# Patient Record
Sex: Male | Born: 1955 | Race: White | Hispanic: No | Marital: Married | Smoking: Never smoker
Health system: Southern US, Community
[De-identification: ages and names within clinical notes are randomized; demographics above are authoritative.]

## PROBLEM LIST (undated history)

## (undated) DIAGNOSIS — I10 Essential (primary) hypertension: Secondary | ICD-10-CM

## (undated) DIAGNOSIS — I499 Cardiac arrhythmia, unspecified: Secondary | ICD-10-CM

## (undated) DIAGNOSIS — E1140 Type 2 diabetes mellitus with diabetic neuropathy, unspecified: Secondary | ICD-10-CM

## (undated) DIAGNOSIS — E782 Mixed hyperlipidemia: Secondary | ICD-10-CM

## (undated) DIAGNOSIS — I480 Paroxysmal atrial fibrillation: Secondary | ICD-10-CM

## (undated) DIAGNOSIS — E119 Type 2 diabetes mellitus without complications: Secondary | ICD-10-CM

## (undated) DIAGNOSIS — G935 Compression of brain: Secondary | ICD-10-CM

## (undated) DIAGNOSIS — E538 Deficiency of other specified B group vitamins: Secondary | ICD-10-CM

## (undated) DIAGNOSIS — K56609 Unspecified intestinal obstruction, unspecified as to partial versus complete obstruction: Secondary | ICD-10-CM

## (undated) DIAGNOSIS — G629 Polyneuropathy, unspecified: Secondary | ICD-10-CM

## (undated) DIAGNOSIS — E1165 Type 2 diabetes mellitus with hyperglycemia: Secondary | ICD-10-CM

## (undated) DIAGNOSIS — K429 Umbilical hernia without obstruction or gangrene: Secondary | ICD-10-CM

## (undated) DIAGNOSIS — N189 Chronic kidney disease, unspecified: Secondary | ICD-10-CM

## (undated) HISTORY — DX: Essential (primary) hypertension: I10

## (undated) HISTORY — DX: Type 2 diabetes mellitus with diabetic neuropathy, unspecified: E11.40

## (undated) HISTORY — DX: Type 2 diabetes mellitus with hyperglycemia: E11.65

## (undated) HISTORY — DX: Umbilical hernia without obstruction or gangrene: K42.9

## (undated) HISTORY — DX: Mixed hyperlipidemia: E78.2

## (undated) HISTORY — DX: Paroxysmal atrial fibrillation: I48.0

## (undated) HISTORY — DX: Deficiency of other specified B group vitamins: E53.8

## (undated) HISTORY — PX: TONSILLECTOMY: SUR1361

## (undated) HISTORY — DX: Unspecified intestinal obstruction, unspecified as to partial versus complete obstruction: K56.609

---

## 2005-08-26 ENCOUNTER — Ambulatory Visit: Payer: Self-pay | Admitting: Gastroenterology

## 2007-05-25 ENCOUNTER — Ambulatory Visit: Payer: Self-pay | Admitting: Internal Medicine

## 2011-05-27 ENCOUNTER — Ambulatory Visit: Payer: Self-pay | Admitting: Gastroenterology

## 2013-10-24 DIAGNOSIS — E782 Mixed hyperlipidemia: Secondary | ICD-10-CM | POA: Insufficient documentation

## 2013-10-24 DIAGNOSIS — I10 Essential (primary) hypertension: Secondary | ICD-10-CM

## 2013-10-24 HISTORY — DX: Mixed hyperlipidemia: E78.2

## 2013-10-24 HISTORY — DX: Essential (primary) hypertension: I10

## 2013-11-12 DIAGNOSIS — E538 Deficiency of other specified B group vitamins: Secondary | ICD-10-CM

## 2013-11-12 HISTORY — DX: Deficiency of other specified B group vitamins: E53.8

## 2014-05-01 DIAGNOSIS — E1140 Type 2 diabetes mellitus with diabetic neuropathy, unspecified: Secondary | ICD-10-CM

## 2014-05-01 DIAGNOSIS — IMO0002 Reserved for concepts with insufficient information to code with codable children: Secondary | ICD-10-CM | POA: Insufficient documentation

## 2014-05-01 DIAGNOSIS — E1165 Type 2 diabetes mellitus with hyperglycemia: Secondary | ICD-10-CM

## 2014-05-01 HISTORY — DX: Type 2 diabetes mellitus with diabetic neuropathy, unspecified: E11.40

## 2014-05-01 HISTORY — DX: Reserved for concepts with insufficient information to code with codable children: IMO0002

## 2014-11-06 DIAGNOSIS — I480 Paroxysmal atrial fibrillation: Secondary | ICD-10-CM

## 2014-11-06 HISTORY — DX: Paroxysmal atrial fibrillation: I48.0

## 2016-09-29 ENCOUNTER — Emergency Department: Payer: BLUE CROSS/BLUE SHIELD

## 2016-09-29 ENCOUNTER — Inpatient Hospital Stay: Payer: BLUE CROSS/BLUE SHIELD

## 2016-09-29 ENCOUNTER — Inpatient Hospital Stay
Admission: EM | Admit: 2016-09-29 | Discharge: 2016-10-02 | DRG: 395 | Disposition: A | Discharge status: Home / Self Care | Payer: BLUE CROSS/BLUE SHIELD | Attending: Surgery | Admitting: Surgery

## 2016-09-29 ENCOUNTER — Encounter: Payer: Self-pay | Admitting: Emergency Medicine

## 2016-09-29 DIAGNOSIS — Z794 Long term (current) use of insulin: Secondary | ICD-10-CM

## 2016-09-29 DIAGNOSIS — E119 Type 2 diabetes mellitus without complications: Secondary | ICD-10-CM | POA: Diagnosis present

## 2016-09-29 DIAGNOSIS — K429 Umbilical hernia without obstruction or gangrene: Secondary | ICD-10-CM | POA: Diagnosis not present

## 2016-09-29 DIAGNOSIS — R1084 Generalized abdominal pain: Secondary | ICD-10-CM

## 2016-09-29 DIAGNOSIS — K56609 Unspecified intestinal obstruction, unspecified as to partial versus complete obstruction: Secondary | ICD-10-CM | POA: Diagnosis not present

## 2016-09-29 DIAGNOSIS — Z6832 Body mass index (BMI) 32.0-32.9, adult: Secondary | ICD-10-CM

## 2016-09-29 DIAGNOSIS — I4891 Unspecified atrial fibrillation: Secondary | ICD-10-CM | POA: Diagnosis not present

## 2016-09-29 DIAGNOSIS — Z79899 Other long term (current) drug therapy: Secondary | ICD-10-CM | POA: Diagnosis not present

## 2016-09-29 DIAGNOSIS — I482 Chronic atrial fibrillation: Secondary | ICD-10-CM | POA: Diagnosis present

## 2016-09-29 DIAGNOSIS — K420 Umbilical hernia with obstruction, without gangrene: Principal | ICD-10-CM | POA: Diagnosis present

## 2016-09-29 DIAGNOSIS — Z7984 Long term (current) use of oral hypoglycemic drugs: Secondary | ICD-10-CM

## 2016-09-29 DIAGNOSIS — I10 Essential (primary) hypertension: Secondary | ICD-10-CM | POA: Diagnosis present

## 2016-09-29 DIAGNOSIS — K460 Unspecified abdominal hernia with obstruction, without gangrene: Secondary | ICD-10-CM | POA: Diagnosis present

## 2016-09-29 DIAGNOSIS — Z7901 Long term (current) use of anticoagulants: Secondary | ICD-10-CM | POA: Diagnosis not present

## 2016-09-29 HISTORY — DX: Type 2 diabetes mellitus without complications: E11.9

## 2016-09-29 HISTORY — DX: Compression of brain: G93.5

## 2016-09-29 HISTORY — DX: Essential (primary) hypertension: I10

## 2016-09-29 LAB — CBC
HEMATOCRIT: 44.4 % (ref 40.0–52.0)
Hemoglobin: 14.9 g/dL (ref 13.0–18.0)
MCH: 27.6 pg (ref 26.0–34.0)
MCHC: 33.6 g/dL (ref 32.0–36.0)
MCV: 82.2 fL (ref 80.0–100.0)
Platelets: 244 10*3/uL (ref 150–440)
RBC: 5.4 MIL/uL (ref 4.40–5.90)
RDW: 14.8 % — ABNORMAL HIGH (ref 11.5–14.5)
WBC: 8.9 10*3/uL (ref 3.8–10.6)

## 2016-09-29 LAB — URINALYSIS, COMPLETE (UACMP) WITH MICROSCOPIC
BACTERIA UA: NONE SEEN
Glucose, UA: NEGATIVE mg/dL
Hgb urine dipstick: NEGATIVE
KETONES UR: 5 mg/dL — AB
Leukocytes, UA: NEGATIVE
Nitrite: NEGATIVE
PROTEIN: 100 mg/dL — AB
Specific Gravity, Urine: 1.036 — ABNORMAL HIGH (ref 1.005–1.030)
pH: 5 (ref 5.0–8.0)

## 2016-09-29 LAB — COMPREHENSIVE METABOLIC PANEL
ALBUMIN: 4.4 g/dL (ref 3.5–5.0)
ALT: 10 U/L — ABNORMAL LOW (ref 17–63)
AST: 24 U/L (ref 15–41)
Alkaline Phosphatase: 48 U/L (ref 38–126)
Anion gap: 10 (ref 5–15)
BUN: 22 mg/dL — AB (ref 6–20)
CHLORIDE: 100 mmol/L — AB (ref 101–111)
CO2: 28 mmol/L (ref 22–32)
Calcium: 10.2 mg/dL (ref 8.9–10.3)
Creatinine, Ser: 1.32 mg/dL — ABNORMAL HIGH (ref 0.61–1.24)
GFR calc Af Amer: >60 mL/min (ref >60)
GFR calc non Af Amer: 56 mL/min — ABNORMAL LOW (ref >60)
Glucose, Bld: 88 mg/dL (ref 65–99)
POTASSIUM: 3.9 mmol/L (ref 3.5–5.1)
Sodium: 138 mmol/L (ref 135–145)
Total Bilirubin: 1 mg/dL (ref 0.3–1.2)
Total Protein: 7.4 g/dL (ref 6.5–8.1)

## 2016-09-29 LAB — GLUCOSE, CAPILLARY: Glucose-Capillary: 87 mg/dL (ref 65–99)

## 2016-09-29 LAB — LIPASE, BLOOD: Lipase: 21 U/L (ref 11–51)

## 2016-09-29 MED ORDER — METOPROLOL TARTRATE 5 MG/5ML IV SOLN
5.0000 mg | INTRAVENOUS | Status: DC | PRN
  Administered 2016-09-29: 5 mg via INTRAVENOUS
  Filled 2016-09-29: qty 5

## 2016-09-29 MED ORDER — HEPARIN (PORCINE) IN NACL 100-0.45 UNIT/ML-% IJ SOLN
1200.0000 [IU]/h | INTRAMUSCULAR | Status: DC
  Administered 2016-09-29: 1200 [IU]/h via INTRAVENOUS
  Filled 2016-09-29: qty 250

## 2016-09-29 MED ORDER — DILTIAZEM HCL 100 MG IV SOLR
5.0000 mg/h | INTRAVENOUS | Status: DC
  Administered 2016-09-30: 5 mg/h via INTRAVENOUS
  Filled 2016-09-29 (×3): qty 100

## 2016-09-29 MED ORDER — INSULIN ASPART 100 UNIT/ML ~~LOC~~ SOLN
0.0000 [IU] | Freq: Every day | SUBCUTANEOUS | Status: DC
  Administered 2016-10-01: 2 [IU] via SUBCUTANEOUS
  Filled 2016-09-29: qty 2

## 2016-09-29 MED ORDER — INSULIN ASPART 100 UNIT/ML ~~LOC~~ SOLN
6.0000 [IU] | Freq: Three times a day (TID) | SUBCUTANEOUS | Status: DC
  Administered 2016-09-30 – 2016-10-02 (×8): 6 [IU] via SUBCUTANEOUS
  Filled 2016-09-29 (×8): qty 6

## 2016-09-29 MED ORDER — LACTATED RINGERS IV SOLN
INTRAVENOUS | Status: DC
  Administered 2016-09-29 – 2016-09-30 (×3): via INTRAVENOUS

## 2016-09-29 MED ORDER — FENTANYL CITRATE (PF) 100 MCG/2ML IJ SOLN
50.0000 ug | INTRAMUSCULAR | Status: DC | PRN
  Administered 2016-09-29: 50 ug via INTRAVENOUS
  Filled 2016-09-29: qty 2

## 2016-09-29 MED ORDER — ONDANSETRON 4 MG PO TBDP
4.0000 mg | ORAL_TABLET | Freq: Four times a day (QID) | ORAL | Status: DC | PRN
  Filled 2016-09-29: qty 1

## 2016-09-29 MED ORDER — HEPARIN SODIUM (PORCINE) 5000 UNIT/ML IJ SOLN: 5000.0000 [IU] | Freq: Three times a day (TID) | INTRAMUSCULAR | Status: DC

## 2016-09-29 MED ORDER — INSULIN ASPART 100 UNIT/ML ~~LOC~~ SOLN
0.0000 [IU] | Freq: Three times a day (TID) | SUBCUTANEOUS | Status: DC
Start: 2016-09-30 — End: 2016-10-02
  Administered 2016-09-30 (×2): 3 [IU] via SUBCUTANEOUS
  Administered 2016-10-01: 7 [IU] via SUBCUTANEOUS
  Administered 2016-10-01: 4 [IU] via SUBCUTANEOUS
  Administered 2016-10-01: 3 [IU] via SUBCUTANEOUS
  Administered 2016-10-02: 4 [IU] via SUBCUTANEOUS
  Administered 2016-10-02: 7 [IU] via SUBCUTANEOUS
  Filled 2016-09-29: qty 7
  Filled 2016-09-29 (×2): qty 3
  Filled 2016-09-29: qty 7
  Filled 2016-09-29: qty 3
  Filled 2016-09-29 (×2): qty 4

## 2016-09-29 MED ORDER — METOPROLOL TARTRATE 5 MG/5ML IV SOLN: 5.0000 mg | Freq: Four times a day (QID) | INTRAVENOUS | Status: DC

## 2016-09-29 MED ORDER — INSULIN ASPART 100 UNIT/ML ~~LOC~~ SOLN: 0.0000 [IU] | Freq: Three times a day (TID) | SUBCUTANEOUS | Status: DC

## 2016-09-29 MED ORDER — ONDANSETRON HCL 4 MG/2ML IJ SOLN: 4.0000 mg | Freq: Four times a day (QID) | INTRAMUSCULAR | Status: DC | PRN

## 2016-09-29 MED ORDER — PIPERACILLIN-TAZOBACTAM 3.375 G IVPB 30 MIN
3.3750 g | Freq: Once | INTRAVENOUS | Status: AC
  Administered 2016-09-29: 3.375 g via INTRAVENOUS
  Filled 2016-09-29: qty 50

## 2016-09-29 MED ORDER — SODIUM CHLORIDE 0.9 % IV BOLUS (SEPSIS)
500.0000 mL | Freq: Once | INTRAVENOUS | Status: AC
  Administered 2016-09-29: 500 mL via INTRAVENOUS

## 2016-09-29 MED ORDER — METOPROLOL TARTRATE 5 MG/5ML IV SOLN: 5.0000 mg | INTRAVENOUS | Status: DC | PRN

## 2016-09-29 MED ORDER — HYDRALAZINE HCL 20 MG/ML IJ SOLN: 10.0000 mg | INTRAMUSCULAR | Status: DC | PRN

## 2016-09-29 MED ORDER — METOPROLOL TARTRATE 5 MG/5ML IV SOLN
5.0000 mg | Freq: Four times a day (QID) | INTRAVENOUS | Status: DC
  Administered 2016-09-29 – 2016-10-01 (×6): 5 mg via INTRAVENOUS
  Filled 2016-09-29 (×6): qty 5

## 2016-09-29 MED ORDER — IOPAMIDOL (ISOVUE-300) INJECTION 61%
100.0000 mL | Freq: Once | INTRAVENOUS | Status: AC | PRN
  Administered 2016-09-29: 100 mL via INTRAVENOUS

## 2016-09-29 MED ORDER — MORPHINE SULFATE (PF) 2 MG/ML IV SOLN
2.0000 mg | INTRAVENOUS | Status: DC | PRN
Start: 2016-09-29 — End: 2016-10-02

## 2016-09-29 MED ORDER — DILTIAZEM HCL 100 MG IV SOLR
5.0000 mg/h | INTRAVENOUS | Status: DC
  Administered 2016-09-29: 5 mg/h via INTRAVENOUS
  Filled 2016-09-29: qty 100

## 2016-09-29 MED ORDER — ONDANSETRON HCL 4 MG/2ML IJ SOLN
4.0000 mg | Freq: Once | INTRAMUSCULAR | Status: AC
  Administered 2016-09-29: 4 mg via INTRAVENOUS
  Filled 2016-09-29: qty 2

## 2016-09-29 MED ORDER — BENZOCAINE 20 % MT SOLN: Freq: Once | OROMUCOSAL | Status: DC

## 2016-09-29 MED ORDER — HEPARIN BOLUS VIA INFUSION
5000.0000 [IU] | Freq: Once | INTRAVENOUS | Status: AC
  Administered 2016-09-29: 5000 [IU] via INTRAVENOUS
  Filled 2016-09-29: qty 5000

## 2016-09-29 MED ORDER — BUTAMBEN-TETRACAINE-BENZOCAINE 2-2-14 % EX AERO
INHALATION_SPRAY | CUTANEOUS | Status: AC
  Administered 2016-09-29: 1
  Filled 2016-09-29: qty 20

## 2016-09-29 NOTE — ED Notes (Signed)
Per xray, will advance NG slightly per Dr Quentin Cornwall

## 2016-09-29 NOTE — H&P (Signed)
Patient ID: DERREL Space, male   DOB: 1956/04/12, 61 y.o.   MRN: [id number]  HPI Reginald Phillips is a 61 y.o. male with multiple morbidities including diabetes, obesity, hypertension, A. fib on anticoagulation. Percents with a two-day history of abdominal pain nausea and vomiting. He reports that the pain is periumbilical and worsening with certain movements. Last bowel movement was yesterday. He states that he has had this umbilical hernia for several years with intermittent symptoms. This time this is the worst episode he ever had. Told me that the last dose of sterile to was early this morning. CT scan performed and I have personally reviewed there is evidence of multiple dilated loops of small bowel and with incarceration and transition area in the umbilical region. There is no free air no pneumatosis. Creatinine is 1.3 and white count is 8.9. Last echo per cards ote was 1 year ago EF 45% w diastolic Dysfx. No prior abdominal operations  HPI  Past Medical History:  Diagnosis Date  . Diabetes mellitus without complication ([place])   . Hernia cerebri ([place])   . Hypertension     Past Surgical History:  Procedure Laterality Date  . TONSILLECTOMY      No family history on file.  Social History Social History  Substance Use Topics  . Smoking status: Never Smoker  . Smokeless tobacco: Never Used  . Alcohol use No    No Known Allergies  Current Facility-Administered Medications  Medication Dose Route Frequency Provider Last Rate Last Dose  . benzocaine (HURRICAINE) 20 % mouth spray   Mouth/Throat Once Merlyn Lot, MD      . diltiazem (CARDIZEM) 100 mg in dextrose 5 % 100 mL (1 mg/mL) infusion  5-15 mg/hr Intravenous Titrated Merlyn Lot, MD 5 mL/hr at 09/29/16 2023 5 mg/hr at 09/29/16 2023  . fentaNYL (SUBLIMAZE) injection 50 mcg  50 mcg Intravenous Q1H PRN Merlyn Lot, MD   50 mcg at 09/29/16 1815  . metoprolol tartrate (LOPRESSOR) injection 5 mg  5 mg Intravenous Q5  min PRN Merlyn Lot, MD       Current Outpatient Prescriptions  Medication Sig Dispense Refill  . co-enzyme Q-10 50 MG capsule Take 50 mg by mouth daily.    . fenofibrate 160 MG tablet Take 160 mg by mouth daily.    . insulin aspart (NOVOLOG) 100 UNIT/ML injection Inject 40 Units into the skin 3 (three) times daily before meals. Via insulin pump    . liraglutide 18 MG/3ML SOPN Inject 1.8 mg into the skin daily.    Marland Kitchen losartan (COZAAR) 50 MG tablet Take 25 mg by mouth daily.    . metFORMIN (GLUCOPHAGE) 1000 MG tablet Take 1,000 mg by mouth 2 (two) times daily with a meal.    . metoprolol tartrate (LOPRESSOR) 25 MG tablet Take 25 mg by mouth 2 (two) times daily.    . multivitamin (ONE-A-DAY MEN'S) TABS tablet Take 1 tablet by mouth daily.    . rivaroxaban (XARELTO) 20 MG TABS tablet Take 20 mg by mouth every morning.    . rosuvastatin (CRESTOR) 5 MG tablet Take 5 mg by mouth at bedtime. On Monday, Wednesday, and Friday       Review of Systems Full ROS  was asked and was negative except for the information on the HPI  Physical Exam Blood pressure 135/87, pulse (!) 126, temperature 97.7 F (36.5 C), temperature source Oral, resp. rate (!) 26, height 5' 10.5" (1.791 m), weight 108 kg (238 lb), SpO2 94 %.  CONSTITUTIONAL: PT is morbidly obese and chronically ill  EYES: Pupils are equal, round, and reactive to light, Sclera are non-icteric. EARS, NOSE, MOUTH AND THROAT: The oropharynx is clear. The oral mucosa is pink and moist. Hearing is intact to voice. LYMPH NODES:  Lymph nodes in the neck are normal. RESPIRATORY:  Lungs are clear. There is normal respiratory effort, with equal breath sounds bilaterally, and without pathologic use of accessory muscles. CARDIOVASCULAR: Heart is iregular without murmurs, gallops, or rubs., when I examined him his HR was 154 GI: The abdomen is  soft, mildy tender, he did have an incarcerated UH that I was able to gently reduce in the Er. He felt much  better after that. No peritonitis and mild distension. GU: Rectal deferred.   MUSCULOSKELETAL: Normal muscle strength and tone. No cyanosis or edema.   SKIN: Turgor is good and there are no pathologic skin lesions or ulcers. NEUROLOGIC: Motor and sensation is grossly normal. Cranial nerves are grossly intact. PSYCH:  Oriented to person, place and time. Affect is normal.  Data Reviewed  I have personally reviewed the patient's imaging, laboratory findings and medical records.    Assessment/Plan CL incarcerated umbilical hernia now reduced no evidence of peritonitis. Patient with multiple comorbidities including A. fib with RVR, obesity, DM as well as anticoagulated on Xarelto. Currently no need for emergent surgical intervention we will admit him to the hospital for serial abdominal exams, weight for her anticoaug[place]lation to wear off and also obtain cardiology consultation for preoperative optimization. Ideally this patient will benefit from an inpt repair  once his medical issues stabilysed. When I saw him earlier he was in  A. fib with RVR but apparently more recently his rate is controlled. Obviously he has Multiple risk factors that increase the risk of potential complications on the other hand he did develop transient incarceration of his hernia and I do think that the safest thing to do is to do a semi-urgent repair once his condition improves. We will hold anticoagulation for now D/W Dr. Quentin Cornwall in detail as well as with hospitalist on call.   Caroleen Hamman, MD FACS General Surgeon 09/29/2016, 8:32 PM

## 2016-09-29 NOTE — ED Notes (Signed)
NG placement correct per Dr Quentin Cornwall via xray results

## 2016-09-29 NOTE — ED Triage Notes (Signed)
Pt reports sent here by [facility] for evaluation of bowel obstruction. Pt reports generalized abdominal pain. Pt reports nausea and vomiting in the past two days. Pt reports last BM this morning but was very small. Pt with noted abdominal distention in triage. No apparent distress noted.

## 2016-09-29 NOTE — ED Provider Notes (Signed)
[facility] Emergency Department Provider Note    None    (approximate)  I have reviewed the triage vital signs and the nursing notes.   HISTORY  Chief Complaint Abdominal Pain and Bowel Obstruction    HPI Reginald Phillips is a 61 y.o. male history of diabetes as well as a periumbilical hernia presents with 2-3 days of worsening abdominal pain associated with distention and decreased bowel movements. States that he is still passing gas. Has had decreased appetite. States that he did have breakfast but didn't want to eat anything. Does not had any vomiting. Denies any chest pain or shortness of breath. Patient was seen by his PCP today and was directed to the ER due to concern for small bowel obstruction.  Patient states the pain does radiate through to his back. It is moderate in severity. States it is constant.  No melena or hematochezia.   Past Medical History:  Diagnosis Date  . Diabetes mellitus without complication ([place])   . Hernia cerebri ([place])   . Hypertension    No family history on file. Past Surgical History:  Procedure Laterality Date  . TONSILLECTOMY     There are no active problems to display for this patient.     Prior to Admission medications   Not on File    Allergies Patient has no known allergies.    Social History Social History  Substance Use Topics  . Smoking status: Never Smoker  . Smokeless tobacco: Never Used  . Alcohol use No    Review of Systems Patient denies headaches, rhinorrhea, blurry vision, numbness, shortness of breath, chest pain, edema, cough, abdominal pain, nausea, vomiting, diarrhea, dysuria, fevers, rashes or hallucinations unless otherwise stated above in HPI. ____________________________________________   PHYSICAL EXAM:  VITAL SIGNS: Vitals:   09/29/16 1629  BP: (!) 141/72  Pulse: 79  Resp: 18  Temp: 97.7 F (36.5 C)    Constitutional: Alert and oriented. Well appearing and in no  acute distress. Eyes: Conjunctivae are normal.  Head: Atraumatic. Nose: No congestion/rhinnorhea. Mouth/Throat: Mucous membranes are moist.   Neck: No stridor. Painless ROM.  Cardiovascular: Normal rate, regular rhythm. Grossly normal heart sounds.  Good peripheral circulation. Respiratory: Normal respiratory effort.  No retractions. Lungs CTAB. Gastrointestinal: distended, diffusely ttp without guarding or rebound,  Partially reducible periumbilcal hernia without overyling cellulitis or warmth,  No tympany, hypoactive bs Musculoskeletal: No lower extremity tenderness nor edema.  No joint effusions. Neurologic:  Normal speech and language. No gross focal neurologic deficits are appreciated. No facial droop Skin:  Skin is warm, dry and intact. No rash noted. Psychiatric: Mood and affect are normal. Speech and behavior are normal.  ____________________________________________   LABS (all labs ordered are listed, but only abnormal results are displayed)  Results for orders placed or performed during the hospital encounter of 09/29/16 (from the past 24 hour(s))  Lipase, blood     Status: None   Collection Time: 09/29/16  4:29 PM  Result Value Ref Range   Lipase 21 11 - 51 U/L  Comprehensive metabolic panel     Status: Abnormal   Collection Time: 09/29/16  4:29 PM  Result Value Ref Range   Sodium 138 135 - 145 mmol/L   Potassium 3.9 3.5 - 5.1 mmol/L   Chloride 100 (L) 101 - 111 mmol/L   CO2 28 22 - 32 mmol/L   Glucose, Bld 88 65 - 99 mg/dL   BUN 22 (H) 6 - 20 mg/dL  Creatinine, Ser 1.32 (H) 0.61 - 1.24 mg/dL   Calcium 10.2 8.9 - 10.3 mg/dL   Total Protein 7.4 6.5 - 8.1 g/dL   Albumin 4.4 3.5 - 5.0 g/dL   AST 24 15 - 41 U/L   ALT 10 (L) 17 - 63 U/L   Alkaline Phosphatase 48 38 - 126 U/L   Total Bilirubin 1.0 0.3 - 1.2 mg/dL   GFR calc non Af Amer 56 (L) >60 mL/min   GFR calc Af Amer >60 >60 mL/min   Anion gap 10 5 - 15  CBC     Status: Abnormal   Collection Time: 09/29/16   4:29 PM  Result Value Ref Range   WBC 8.9 3.8 - 10.6 K/uL   RBC 5.40 4.40 - 5.90 MIL/uL   Hemoglobin 14.9 13.0 - 18.0 g/dL   HCT 44.4 40.0 - 52.0 %   MCV 82.2 80.0 - 100.0 fL   MCH 27.6 26.0 - 34.0 pg   MCHC 33.6 32.0 - 36.0 g/dL   RDW 14.8 (H) 11.5 - 14.5 %   Platelets 244 150 - 440 K/uL  Urinalysis, Complete w Microscopic     Status: Abnormal   Collection Time: 09/29/16  4:32 PM  Result Value Ref Range   Color, Urine AMBER (A) YELLOW   APPearance HAZY (A) CLEAR   Specific Gravity, Urine 1.036 (H) 1.005 - 1.030   pH 5.0 5.0 - 8.0   Glucose, UA NEGATIVE NEGATIVE mg/dL   Hgb urine dipstick NEGATIVE NEGATIVE   Bilirubin Urine SMALL (A) NEGATIVE   Ketones, ur 5 (A) NEGATIVE mg/dL   Protein, ur 100 (A) NEGATIVE mg/dL   Nitrite NEGATIVE NEGATIVE   Leukocytes, UA NEGATIVE NEGATIVE   RBC / HPF 0-5 0 - 5 RBC/hpf   WBC, UA 0-5 0 - 5 WBC/hpf   Bacteria, UA NONE SEEN NONE SEEN   Squamous Epithelial / LPF 0-5 (A) NONE SEEN   Mucous PRESENT    Ca Oxalate Crys, UA PRESENT    ____________________________________________ ____________________________________________  RADIOLOGY  I personally reviewed all radiographic images ordered to evaluate for the above acute complaints and reviewed radiology reports and findings.  These findings were personally discussed with the patient.  Please see medical record for radiology report.  ____________________________________________   PROCEDURES  Procedure(s) performed:  Procedures    Critical Care performed: yes CRITICAL CARE Performed by: Merlyn Lot   Total critical care time: 30 minutes  Critical care time was exclusive of separately billable procedures and treating other patients.  Critical care was necessary to treat or prevent imminent or life-threatening deterioration.  Critical care was time spent personally by me on the following activities: development of treatment plan with patient and/or surrogate as well as nursing,  discussions with consultants, evaluation of patient's response to treatment, examination of patient, obtaining history from patient or surrogate, ordering and performing treatments and interventions, ordering and review of laboratory studies, ordering and review of radiographic studies, pulse oximetry and re-evaluation of patient's condition.  ____________________________________________   INITIAL IMPRESSION / ASSESSMENT AND PLAN / ED COURSE  Pertinent labs & imaging results that were available during my care of the patient were reviewed by me and considered in my medical decision making (see chart for details).  DDX: ileus, enteritis, hernia, sbo, pancreatitis, mass  Reginald Phillips is a 61 y.o. who presents to the ED with abdominal distention of nausea and bloating. Patient with periumbilical hernia. CT imaging ordered due to concern for small bowel obstruction shows  small bowel obstruction likely secondary to large periumbilical hernia. I was unable to reduce its this at bedside. Dr. Dahlia Byes with general surgery Evaluate patient was able to successfully reduce this at bedside. In the interim patient did go into A. fib with RVR requiring IV doses for rate control as the patient is to be kept nothing by mouth as they are planning to perform hernia repair. Hospice also consult to further assist in medical management.  NG tube inserted without complication.  Have discussed with the patient and available family all diagnostics and treatments performed thus far and all questions were answered to the best of my ability. The patient demonstrates understanding and agreement with plan.       ____________________________________________   FINAL CLINICAL IMPRESSION(S) / ED DIAGNOSES  Final diagnoses:  Periumbilical hernia  Generalized abdominal pain  Atrial fibrillation with rapid ventricular response ([facility])  Small bowel obstruction ([place])      NEW MEDICATIONS STARTED DURING THIS VISIT:  New  Prescriptions   No medications on file     Note:  This document was prepared using Dragon voice recognition software and may include unintentional dictation errors.    Merlyn Lot, MD 09/29/16 2052

## 2016-09-29 NOTE — H&P (Signed)
Patient Demographics  Reginald Phillips, is a 61 y.o. male   MRN: [id number]   DOB - 1955/05/07  Admit Date - 09/29/2016    Outpatient Primary MD for the patient is [name], [name], MD  Consult requested in the Hospital by [place], [place], MD, On 09/29/2016    Reason for consult ; atrial fibrillation, diabetes mellitus management. HPI  Reginald Phillips  is a 61 y.o. male, with history of diabetes mellitus type 2, chronic atrial fibrillation, obesity comes in because of abdominal pain, nausea, vomiting since last Saturday, symptoms are not getting better, patient had periumbilical pain worsening with certain movements. Last BM was yesterday. Patient had him medical hernia for many years and today patient had a CAT scan of abdomen the emergency room which showed small bowel obstruction with incarcerated umbilical hernia. Patient is admitted surgical service. I asked to see patient for medical problems including chronic atrial fibrillation, diabetes mellitus type 2. Patient follows up with Dr. Nehemiah Massed for management of A. fib and he is taking the Xarelto, patient dictated Xarelto this morning. Patient received NG tube in the emergency room, right now he denies abdominal pain. Heart rate is ranging between 100-110.    With History of -  Past Medical History:  Diagnosis Date  . Diabetes mellitus without complication ([place])   . Hernia cerebri ([place])   . Hypertension       Past Surgical History:  Procedure Laterality Date  . TONSILLECTOMY      in for   Chief Complaint  Patient presents with  . Abdominal Pain  . Bowel Obstruction        Review of Systems    In addition to the HPI above,  No Fever-chills, No Headache, No changes with Vision or hearing, No problems swallowing food or Liquids, No Chest pain, Cough or Shortness  of Breath, Abdominal pain, nausea, vomiting for 3 days No Blood in stool or Urine, No dysuria, No new skin rashes or bruises, No new joints pains-aches,  No new weakness, tingling, numbness in any extremity, No recent weight gain or loss, No polyuria, polydypsia or polyphagia, No significant Mental Stressors.  A full 10 point Review of Systems was done, except as stated above, all other Review of Systems were negative.   Social History Social History  Substance Use Topics  . Smoking status: Never Smoker  . Smokeless tobacco: Never Used  . Alcohol use No     Family History No family history on file.   Prior to Admission medications   Medication Sig Start Date End Date Taking? Authorizing Provider  co-enzyme Q-10 50 MG capsule Take 50 mg by mouth daily.   Yes [provider]  fenofibrate 160 MG tablet Take 160 mg by mouth daily.   Yes [provider]  insulin aspart (NOVOLOG) 100 UNIT/ML injection Inject 40 Units into the skin 3 (three) times daily before meals. Via insulin pump   Yes [provider]  liraglutide 18 MG/3ML SOPN Inject 1.8 mg  into the skin daily.   Yes [provider]  losartan (COZAAR) 50 MG tablet Take 25 mg by mouth daily.   Yes [provider]  metFORMIN (GLUCOPHAGE) 1000 MG tablet Take 1,000 mg by mouth 2 (two) times daily with a meal.   Yes [provider]  metoprolol tartrate (LOPRESSOR) 25 MG tablet Take 25 mg by mouth 2 (two) times daily.   Yes [provider]  multivitamin (ONE-A-DAY MEN'S) TABS tablet Take 1 tablet by mouth daily.   Yes [provider]  rivaroxaban (XARELTO) 20 MG TABS tablet Take 20 mg by mouth every morning.   Yes [provider]  rosuvastatin (CRESTOR) 5 MG tablet Take 5 mg by mouth at bedtime. On Monday, Wednesday, and Friday   Yes [provider]    Anti-infectives    Start     Dose/Rate Route Frequency Ordered Stop   09/29/16 1915   piperacillin-tazobactam (ZOSYN) IVPB 3.375 g     3.375 g 100 mL/hr over 30 Minutes Intravenous  Once 09/29/16 1906 09/29/16 2022      Scheduled Meds: . benzocaine   Mouth/Throat Once  . heparin  5,000 Units Subcutaneous Q8H  . [START ON 09/30/2016] insulin aspart  0-20 Units Subcutaneous TID WC  . insulin aspart  0-5 Units Subcutaneous QHS  . [START ON 09/30/2016] insulin aspart  0-9 Units Subcutaneous TID WC  . [START ON 09/30/2016] insulin aspart  6 Units Subcutaneous TID WC  . [START ON 09/30/2016] metoprolol tartrate  5 mg Intravenous Q6H  . [START ON 09/30/2016] metoprolol tartrate  5 mg Intravenous Q6H   Continuous Infusions: . diltiazem (CARDIZEM) infusion 5 mg/hr (09/29/16 2023)  . lactated ringers     PRN Meds:.fentaNYL (SUBLIMAZE) injection, hydrALAZINE, metoprolol tartrate, morphine injection, ondansetron **OR** ondansetron (ZOFRAN) IV  No Known Allergies  Physical Exam  Vitals  Blood pressure 135/87, pulse (!) 126, temperature 97.7 F (36.5 C), temperature source Oral, resp. rate (!) 26, height 5' 10.5" (1.791 m), weight 108 kg (238 lb), SpO2 94 %.   1. General alert,lying in bed  With NG tube.  2. Normal affect and insight, Not Suicidal or Homicidal, Awake Alert, Oriented X 3.  3. No F.N deficits, ALL C.Nerves Intact, Strength 5/5 all 4 extremities, Sensation intact all 4 extremities, Plantars down going.  4. Ears and Eyes appear Normal, Conjunctivae clear, PERRLA. Moist Oral Mucosa.  5. Supple Neck, No JVD, No cervical lymphadenopathy appriciated, No Carotid Bruits.  6. Symmetrical Chest wall movement, Good air movement bilaterally, CTAB.  7. RRR, No Gallops, Rubs or Murmurs, No Parasternal Heave.  8. Abdomen  is distended with poor bowel sounds, umbilical hernia is reduced in the emergency room by surgery. 9.  No Cyanosis, Normal Skin Turgor, No Skin Rash or Bruise.  10. Good muscle tone,  joints appear normal , no effusions, Normal ROM.  11. No Palpable Lymph  Nodes in Neck or Axillae    Data Review  CBC  Recent Labs Lab 09/29/16 1629  WBC 8.9  HGB 14.9  HCT 44.4  PLT 244  MCV 82.2  MCH 27.6  MCHC 33.6  RDW 14.8*   ------------------------------------------------------------------------------------------------------------------  Chemistries   Recent Labs Lab 09/29/16 1629  NA 138  K 3.9  CL 100*  CO2 28  GLUCOSE 88  BUN 22*  CREATININE 1.32*  CALCIUM 10.2  AST 24  ALT 10*  ALKPHOS 48  BILITOT 1.0   ------------------------------------------------------------------------------------------------------------------ estimated creatinine clearance is 72 mL/min (A) (by C-G formula  based on SCr of 1.32 mg/dL (H)). ------------------------------------------------------------------------------------------------------------------ No results for input(s): TSH, T4TOTAL, T3FREE, THYROIDAB in the last 72 hours.  Invalid input(s): FREET3   Coagulation profile No results for input(s): INR, PROTIME in the last 168 hours. ------------------------------------------------------------------------------------------------------------------- No results for input(s): DDIMER in the last 72 hours. -------------------------------------------------------------------------------------------------------------------  Cardiac Enzymes No results for input(s): CKMB, TROPONINI, MYOGLOBIN in the last 168 hours.  Invalid input(s): CK ------------------------------------------------------------------------------------------------------------------ Invalid input(s): POCBNP   ---------------------------------------------------------------------------------------------------------------  Urinalysis    Component Value Date/Time   COLORURINE AMBER (A) 09/29/2016 1632   APPEARANCEUR HAZY (A) 09/29/2016 1632   LABSPEC 1.036 (H) 09/29/2016 1632   PHURINE 5.0 09/29/2016 1632   GLUCOSEU NEGATIVE 09/29/2016 1632   HGBUR NEGATIVE 09/29/2016 1632    BILIRUBINUR SMALL (A) 09/29/2016 1632   KETONESUR 5 (A) 09/29/2016 1632   PROTEINUR 100 (A) 09/29/2016 1632   NITRITE NEGATIVE 09/29/2016 1632   LEUKOCYTESUR NEGATIVE 09/29/2016 1632     Imaging results:   Ct Abdomen Pelvis W Contrast  Result Date: 09/29/2016 CLINICAL DATA:  Pt reports sent here by [facility] [place] for evaluation of bowel obstruction. Pt reports generalized abdominal pain, nausea and vomiting in the past two days. Pt reports last BM this morning but was very small. EXAM: CT ABDOMEN AND PELVIS WITH CONTRAST TECHNIQUE: Multidetector CT imaging of the abdomen and pelvis was performed using the standard protocol following bolus administration of intravenous contrast. CONTRAST:  13mL ISOVUE-300 IOPAMIDOL (ISOVUE-300) INJECTION 61% COMPARISON:  None. FINDINGS: Lower chest: No acute abnormality. Hepatobiliary: No focal liver abnormality is seen. No gallstones, gallbladder wall thickening, or biliary dilatation. Pancreas: Unremarkable. No pancreatic ductal dilatation or surrounding inflammatory changes. Spleen: Normal in size without focal abnormality. Adrenals/Urinary Tract: Normal adrenal glands. Small parapelvic cysts left greater than right. No hydronephrosis or focal renal lesion. Urinary bladder incompletely distended. Stomach/Bowel: Small hiatal hernia. The stomach is nondilated. The duodenum is decompressed. However, there are multiple dilated loops of proximal and mid small bowel. There is small bowel involvement into a large paraumbilical hernia, which represents transition point to decompressed distal ileum. No pneumatosis. No portal venous gas. The colon is nondilated, unremarkable. Vascular/Lymphatic: Mild aortoiliac calcified plaque without aneurysm or stenosis. No adenopathy localized. Reproductive: Prostate is unremarkable. Other: No ascites.  No free air. Musculoskeletal: Minimal spurring in the lower thoracic spine. No fracture or worrisome bone lesion. IMPRESSION: 1. Moderately large  paraumbilical hernia with small bowel involvement, causing distal small bowel obstruction. Electronically Signed   By: Lucrezia Europe M.D.   On: 09/29/2016 18:46   Dg Abd Portable 1 View  Result Date: 09/29/2016 CLINICAL DATA:  Gastric tube placement EXAM: PORTABLE ABDOMEN - 1 VIEW COMPARISON:  Same day CT abdomen and pelvis FINDINGS: The end and side port of a gastric tube are seen below the left hemidiaphragm in the expected location of the proximal stomach. Dilated small bowel loops are seen and upper portion of large bowel. There is no pneumoperitoneum. There is cardiomegaly with atelectasis at the lung bases. No acute nor suspicious osseous abnormalities. IMPRESSION: Gastric tube in the expected location of the proximal stomach. Dilated small bowel loops are noted consistent with small bowel obstruction. Electronically Signed   By: Ashley Royalty M.D.   On: 09/29/2016 20:36    afib with HR100 bpm  Assessment & Plan  Active Problems:   Incarcerated hernia  #1 incarcerated umbilical hernia: Admitted to surgery service, management as per surgery, continue nothing by mouth, discuss with Dr.Pabone and he recommended to hold xarelto, new  to hold Xarelto, pharmacy for heparin drip and consult.  2. A. fib with RVR when the patient came heart rate was in 150s but now rate controlled, patient had abdominal pain and he came ,he  received pain medicine and his pain is better, he is comfortable. Obtain cardiology consultation with Dr. Nehemiah Massed, continue IV metoprolol 5 mg every 6 hours for heart rate more than 100. Continue aggressive hydration for SBO> 3. Diabetes mellitus type 2: On metformin, victoza continue sliding scale with coverage, patient is nothing by mouth.   Thank you for the consult, we will follow the patient with you in the Hospital.   [facility] M.D on 09/29/2016 at 8:49 PM

## 2016-09-29 NOTE — ED Notes (Addendum)
Pt lying supine on stretcher in exam room with no distress noted; pt denies any c/o at present and voices good understanding of plan of care; EKG was performed at 1930 and is on paper chart (did not transmit)

## 2016-09-29 NOTE — ED Notes (Signed)
Dr Ara Kussmaul notified pt's HR persistently 110-160; order obtained to continue cardizem drip

## 2016-09-29 NOTE — Progress Notes (Signed)
Patient arrived to 2A Room 238. Patient denies pain and all questions answered. Patient oriented to unit and Fall Safety Plan signed. Skin assessment completed with Yasmin RN and skin intact; NG tube in right nare set to low-intermittent suction. A&Ox4, VSS, and Afib on verified tele-box #[id number]. Nursing staff will continue to monitor for any changes in patient status. Earleen Reaper, RN

## 2016-09-29 NOTE — ED Notes (Signed)
Pt returned from CT, resting in bed, family at bedside 

## 2016-09-29 NOTE — Progress Notes (Signed)
ANTICOAGULATION CONSULT NOTE - Initial Consult  Pharmacy Consult for heparin Indication: atrial fibrillation  No Known Allergies  Patient Measurements: Height: 5' 10.5" (179.1 cm) Weight: 238 lb (108 kg) IBW/kg (Calculated) : 74.15 Heparin Dosing Weight: 97.3 kg  Vital Signs: Temp: 97.7 F (36.5 C) (06/06 1629) Temp Source: Oral (06/06 1629) BP: 135/87 (06/06 1930) Pulse Rate: 126 (06/06 1930)  Labs:  Recent Labs  09/29/16 1629  HGB 14.9  HCT 44.4  PLT 244  CREATININE 1.32*    Estimated Creatinine Clearance: 72 mL/min (A) (by C-G formula based on SCr of 1.32 mg/dL (H)).   Medical History: Past Medical History:  Diagnosis Date  . Diabetes mellitus without complication ([place])   . Hernia cerebri ([place])   . Hypertension     Medications:  Infusions:  . heparin    . lactated ringers      Assessment: 61 yom cc abdominal pain and bowel obstruction. AF noted, pharmacy consulted to dose heparin for AF.   Goal of Therapy:  Heparin level 0.3-0.7 units/ml Monitor platelets by anticoagulation protocol: Yes   Plan:  Give 5000 units bolus x 1 Start heparin infusion at 1200 units/hr Check anti-Xa level in 6 hours and daily while on heparin Continue to monitor H&H and platelets  Laural Benes, Pharm.D., BCPS Clinical Pharmacist 09/29/2016,9:05 PM

## 2016-09-29 NOTE — ED Notes (Signed)
Portable abd xray completed per protocol for NG placement verification

## 2016-09-29 NOTE — ED Notes (Signed)
Patient transported to CT 

## 2016-09-29 NOTE — Plan of Care (Signed)
Problem: Bowel/Gastric: Goal: Will not experience complications related to bowel motility Outcome: Not Progressing Patient is NPO with NG tube set to low-intermittent suction due to small bowel obstruction.

## 2016-09-29 NOTE — ED Notes (Signed)
Pt HR 120s-140s, afib, hx of afib, EKG done, given to EDP, pt states he takes metoprolol BID and has not taken evening dose, no orders received at this time

## 2016-09-30 ENCOUNTER — Inpatient Hospital Stay: Payer: BLUE CROSS/BLUE SHIELD

## 2016-09-30 LAB — COMPREHENSIVE METABOLIC PANEL
ALBUMIN: 3.4 g/dL — AB (ref 3.5–5.0)
ALT: 8 U/L — ABNORMAL LOW (ref 17–63)
AST: 17 U/L (ref 15–41)
Alkaline Phosphatase: 39 U/L (ref 38–126)
Anion gap: 11 (ref 5–15)
BUN: 22 mg/dL — AB (ref 6–20)
CHLORIDE: 102 mmol/L (ref 101–111)
CO2: 24 mmol/L (ref 22–32)
Calcium: 8.5 mg/dL — ABNORMAL LOW (ref 8.9–10.3)
Creatinine, Ser: 1.15 mg/dL (ref 0.61–1.24)
GFR calc Af Amer: >60 mL/min (ref >60)
GFR calc non Af Amer: >60 mL/min (ref >60)
GLUCOSE: 124 mg/dL — AB (ref 65–99)
POTASSIUM: 3.8 mmol/L (ref 3.5–5.1)
Sodium: 137 mmol/L (ref 135–145)
Total Bilirubin: 1.2 mg/dL (ref 0.3–1.2)
Total Protein: 6.2 g/dL — ABNORMAL LOW (ref 6.5–8.1)

## 2016-09-30 LAB — MAGNESIUM: Magnesium: 1.5 mg/dL — ABNORMAL LOW (ref 1.7–2.4)

## 2016-09-30 LAB — CBC
HEMATOCRIT: 38.9 % — AB (ref 40.0–52.0)
HEMOGLOBIN: 12.9 g/dL — AB (ref 13.0–18.0)
MCH: 27.3 pg (ref 26.0–34.0)
MCHC: 33.3 g/dL (ref 32.0–36.0)
MCV: 82.1 fL (ref 80.0–100.0)
Platelets: 176 10*3/uL (ref 150–440)
RBC: 4.73 MIL/uL (ref 4.40–5.90)
RDW: 14.8 % — ABNORMAL HIGH (ref 11.5–14.5)
WBC: 5.4 10*3/uL (ref 3.8–10.6)

## 2016-09-30 LAB — HEPARIN LEVEL (UNFRACTIONATED)
Heparin Unfractionated: 1.19 IU/mL — ABNORMAL HIGH (ref 0.30–0.70)
Heparin Unfractionated: 0.42 IU/mL (ref 0.30–0.70)

## 2016-09-30 LAB — APTT
APTT: 64 s — AB (ref 24–36)
aPTT: 75 seconds — ABNORMAL HIGH (ref 24–36)
aPTT: 48 seconds — ABNORMAL HIGH (ref 24–36)

## 2016-09-30 LAB — PROTIME-INR
INR: 1.32
Prothrombin Time: 16.5 seconds — ABNORMAL HIGH (ref 11.4–15.2)

## 2016-09-30 LAB — GLUCOSE, CAPILLARY
GLUCOSE-CAPILLARY: 149 mg/dL — AB (ref 65–99)
GLUCOSE-CAPILLARY: 130 mg/dL — AB (ref 65–99)
Glucose-Capillary: 100 mg/dL — ABNORMAL HIGH (ref 65–99)
Glucose-Capillary: 178 mg/dL — ABNORMAL HIGH (ref 65–99)

## 2016-09-30 MED ORDER — HEPARIN (PORCINE) IN NACL 100-0.45 UNIT/ML-% IJ SOLN
1400.0000 [IU]/h | INTRAMUSCULAR | Status: DC
  Administered 2016-09-30: 1300 [IU]/h via INTRAVENOUS
  Filled 2016-09-30: qty 250

## 2016-09-30 MED ORDER — HEPARIN (PORCINE) IN NACL 100-0.45 UNIT/ML-% IJ SOLN: 950.0000 [IU]/h | INTRAMUSCULAR | Status: DC

## 2016-09-30 MED ORDER — DIGOXIN 0.25 MG/ML IJ SOLN
0.1250 mg | Freq: Every day | INTRAMUSCULAR | Status: DC
  Administered 2016-09-30 – 2016-10-02 (×3): 0.125 mg via INTRAVENOUS
  Filled 2016-09-30 (×3): qty 0.5

## 2016-09-30 MED ORDER — KCL IN DEXTROSE-NACL 20-5-0.45 MEQ/L-%-% IV SOLN
INTRAVENOUS | Status: DC
  Administered 2016-09-30 – 2016-10-02 (×3): via INTRAVENOUS
  Filled 2016-09-30 (×5): qty 1000

## 2016-09-30 MED ORDER — HEPARIN (PORCINE) IN NACL 100-0.45 UNIT/ML-% IJ SOLN: 1200.0000 [IU]/h | INTRAMUSCULAR | Status: DC

## 2016-09-30 MED ORDER — MAGNESIUM SULFATE 2 GM/50ML IV SOLN
2.0000 g | Freq: Once | INTRAVENOUS | Status: AC
  Administered 2016-09-30: 2 g via INTRAVENOUS
  Filled 2016-09-30: qty 50

## 2016-09-30 NOTE — Progress Notes (Signed)
Patient converted back into Afib with HR in the 110s-130s. Notified MD Pyreddy and order to restart cardizem drip given. Nursing staff will continue to monitor for any changes in patient status. Earleen Reaper, RN

## 2016-09-30 NOTE — Consult Note (Signed)
[facility] Cardiology  CARDIOLOGY CONSULT NOTE  Patient ID: Reginald Phillips MRN: [id number] DOB/AGE: [birth year] 62 y.o.  Admit date: 09/29/2016 Referring Physician [place] Primary Physician [facility] Primary Cardiologist Nehemiah Massed Reason for Consultation Atrial fibrillation  HPI: 61 year old gentleman referred for evaluation of atrial fibrillation. The patient has a history of paroxysmal atrial fibrillation, on Xarelto for stroke prevention. He is admitted with a 2 day history of abdominal pain and nausea, found to have incarcerated umbilical hernia which was reduced. Patient reports overall clinical improvement, had bowel movement last evening. Patient is in atrial fibrillation with initial rapid ventricular rate, which appears improved today, with a heart rate of 93 BPM, on intermittent intravenous digoxin and Lopressor. Patient denies chest pain, shortness of breath, palpitations or heart racing. The patient will likely require semiurgent surgical repair during his hospitalization. Xarelto has been held. Patient currently on heparin drip.  Review of systems complete and found to be negative unless listed above     Past Medical History:  Diagnosis Date  . Diabetes mellitus without complication ([place])   . Hernia cerebri ([place])   . Hypertension     Past Surgical History:  Procedure Laterality Date  . TONSILLECTOMY      Prescriptions Prior to Admission  Medication Sig Dispense Refill Last Dose  . co-enzyme Q-10 50 MG capsule Take 50 mg by mouth daily.   09/29/2016 at am  . fenofibrate 160 MG tablet Take 160 mg by mouth daily.   09/29/2016 at am  . insulin aspart (NOVOLOG) 100 UNIT/ML injection Inject 40 Units into the skin 3 (three) times daily before meals. Via insulin pump   09/29/2016 at pm  . liraglutide 18 MG/3ML SOPN Inject 1.8 mg into the skin daily.   09/29/2016 at am  . losartan (COZAAR) 50 MG tablet Take 25 mg by mouth daily.   09/29/2016 at am  . metFORMIN (GLUCOPHAGE) 1000 MG tablet Take 1,000 mg  by mouth 2 (two) times daily with a meal.   09/29/2016 at am  . metoprolol tartrate (LOPRESSOR) 25 MG tablet Take 25 mg by mouth 2 (two) times daily.   09/29/2016 at 0530  . multivitamin (ONE-A-DAY MEN'S) TABS tablet Take 1 tablet by mouth daily.   09/29/2016 at am  . rivaroxaban (XARELTO) 20 MG TABS tablet Take 20 mg by mouth every morning.   09/29/2016 at 0530  . rosuvastatin (CRESTOR) 5 MG tablet Take 5 mg by mouth at bedtime. On Monday, Wednesday, and Friday   09/27/2016 at pm   Social History   Social History  . Marital status: Married    Spouse name: N/A  . Number of children: N/A  . Years of education: N/A   Occupational History  . Not on file.   Social History Main Topics  . Smoking status: Never Smoker  . Smokeless tobacco: Never Used  . Alcohol use No  . Drug use: No  . Sexual activity: Not on file   Other Topics Concern  . Not on file   Social History Narrative  . No narrative on file    No family history on file.    Review of systems complete and found to be negative unless listed above      PHYSICAL EXAM  General: Well developed, well nourished, in no acute distress HEENT:  Normocephalic and atramatic Neck:  No JVD.  Lungs: Clear bilaterally to auscultation and percussion. Heart: HRRR . Normal S1 and S2 without gallops or murmurs.  Abdomen: Bowel sounds are positive, abdomen soft  and non-tender  Msk:  Back normal, normal gait. Normal strength and tone for age. Extremities: No clubbing, cyanosis or edema.   Neuro: Alert and oriented X 3. Psych:  Good affect, responds appropriately  Labs:   Lab Results  Component Value Date   WBC 5.4 09/30/2016   HGB 12.9 (L) 09/30/2016   HCT 38.9 (L) 09/30/2016   MCV 82.1 09/30/2016   PLT 176 09/30/2016    Recent Labs Lab 09/30/16 0355  NA 137  K 3.8  CL 102  CO2 24  BUN 22*  CREATININE 1.15  CALCIUM 8.5*  PROT 6.2*  BILITOT 1.2  ALKPHOS 39  ALT 8*  AST 17  GLUCOSE 124*   No results found for: CKTOTAL,  CKMB, CKMBINDEX, TROPONINI No results found for: CHOL No results found for: HDL No results found for: LDLCALC No results found for: TRIG No results found for: CHOLHDL No results found for: LDLDIRECT    Radiology: Dg Abdomen 1 View  Result Date: 09/29/2016 CLINICAL DATA:  NG tube placement EXAM: ABDOMEN - 1 VIEW COMPARISON:  09/29/2016 FINDINGS: Lateral view of the abdomen demonstrates tip of the esophageal tube to project over proximal stomach. There are dilated loops of small bowel concerning foreign obstruction. There is contrast in the bladder. IMPRESSION: 1. Esophageal tube tip overlies the proximal stomach 2. Dilated loops of small bowel consistent with bowel obstruction Electronically Signed   By: Donavan Foil M.D.   On: 09/29/2016 21:26   Ct Abdomen Pelvis W Contrast  Result Date: 09/29/2016 CLINICAL DATA:  Pt reports sent here by [facility] [place] for evaluation of bowel obstruction. Pt reports generalized abdominal pain, nausea and vomiting in the past two days. Pt reports last BM this morning but was very small. EXAM: CT ABDOMEN AND PELVIS WITH CONTRAST TECHNIQUE: Multidetector CT imaging of the abdomen and pelvis was performed using the standard protocol following bolus administration of intravenous contrast. CONTRAST:  135mL ISOVUE-300 IOPAMIDOL (ISOVUE-300) INJECTION 61% COMPARISON:  None. FINDINGS: Lower chest: No acute abnormality. Hepatobiliary: No focal liver abnormality is seen. No gallstones, gallbladder wall thickening, or biliary dilatation. Pancreas: Unremarkable. No pancreatic ductal dilatation or surrounding inflammatory changes. Spleen: Normal in size without focal abnormality. Adrenals/Urinary Tract: Normal adrenal glands. Small parapelvic cysts left greater than right. No hydronephrosis or focal renal lesion. Urinary bladder incompletely distended. Stomach/Bowel: Small hiatal hernia. The stomach is nondilated. The duodenum is decompressed. However, there are multiple dilated loops of  proximal and mid small bowel. There is small bowel involvement into a large paraumbilical hernia, which represents transition point to decompressed distal ileum. No pneumatosis. No portal venous gas. The colon is nondilated, unremarkable. Vascular/Lymphatic: Mild aortoiliac calcified plaque without aneurysm or stenosis. No adenopathy localized. Reproductive: Prostate is unremarkable. Other: No ascites.  No free air. Musculoskeletal: Minimal spurring in the lower thoracic spine. No fracture or worrisome bone lesion. IMPRESSION: 1. Moderately large paraumbilical hernia with small bowel involvement, causing distal small bowel obstruction. Electronically Signed   By: Lucrezia Europe M.D.   On: 09/29/2016 18:46   Dg Abd 2 Views  Result Date: 09/30/2016 CLINICAL DATA:  Small-bowel obstruction. EXAM: ABDOMEN - 2 VIEW COMPARISON:  09/29/2016.  CT 09/29/2016. FINDINGS: NG tube tip is in the proximal portion stomach. Advancement approximately 8 cm should be considered. Improvement of small bowel distention. Colonic gas pattern is normal. No free air. Contrast is in the bladder from recent CT. No acute bony abnormality identified. IMPRESSION: 1. NG tube tip noted in the proximal stomach. Advancement  of approximately 8 cm should be considered. 2. Interim improvement of small bowel distention. Colonic gas pattern is normal. No free air . Electronically Signed   By: Marcello Moores  Register   On: 09/30/2016 08:13   Dg Abd Portable 1 View  Result Date: 09/29/2016 CLINICAL DATA:  Gastric tube placement EXAM: PORTABLE ABDOMEN - 1 VIEW COMPARISON:  Same day CT abdomen and pelvis FINDINGS: The end and side port of a gastric tube are seen below the left hemidiaphragm in the expected location of the proximal stomach. Dilated small bowel loops are seen and upper portion of large bowel. There is no pneumoperitoneum. There is cardiomegaly with atelectasis at the lung bases. No acute nor suspicious osseous abnormalities. IMPRESSION: Gastric tube  in the expected location of the proximal stomach. Dilated small bowel loops are noted consistent with small bowel obstruction. Electronically Signed   By: Ashley Royalty M.D.   On: 09/29/2016 20:36    EKG: Atrial fibrillation at a rate of 93 BPM  ASSESSMENT AND PLAN:   1. Atrial fibrillation with controlled ventricular rate, on intermittent intravenous digoxin and Lopressor, and heparin drip for stroke prevention 2. Reduced incarcerated umbilical hernia, awaiting semi-urgent surgical repair  Recommendations  1. Continue current medical therapy 2. Continue to hold Xarelto for now 3. Continue heparin drip 4. Proceed with surgical repair of umbilical hernia after withholding Xarelto 2-3 days 5. No further cardiac diagnostics required at this time  Signed: Isaias Cowman MD,PhD, [facility] 09/30/2016, 1:52 PM

## 2016-09-30 NOTE — Progress Notes (Signed)
ANTICOAGULATION CONSULT NOTE - Initial Consult  Pharmacy Consult for heparin Indication: atrial fibrillation  No Known Allergies  Patient Measurements: Height: 5' 10.5" (179.1 cm) Weight: 233 lb (105.7 kg) IBW/kg (Calculated) : 74.15 Heparin Dosing Weight: 97 kg  Vital Signs: Temp: 98.2 F (36.8 C) (06/07 1210) Temp Source: Oral (06/07 1210) BP: 116/73 (06/07 1210) Pulse Rate: 107 (06/07 1210)  Labs:  Recent Labs  09/29/16 1629 09/30/16 0355 09/30/16 0943 09/30/16 1752  HGB 14.9 12.9*  --   --   HCT 44.4 38.9*  --   --   PLT 244 176  --   --   APTT  --  75* 48* 64*  LABPROT  --  16.5*  --   --   INR  --  1.32  --   --   HEPARINUNFRC  --  1.19* 0.42  --   CREATININE 1.32* 1.15  --   --     Estimated Creatinine Clearance: 81.8 mL/min (by C-G formula based on SCr of 1.15 mg/dL).  Medical History: Past Medical History:  Diagnosis Date  . Diabetes mellitus without complication ([place])   . Hernia cerebri ([place])   . Hypertension    Medications:  Infusions:  . dextrose 5 % and 0.45 % NaCl with KCl 20 mEq/L 75 mL/hr at 09/30/16 1811  . diltiazem (CARDIZEM) infusion 5 mg/hr (09/30/16 1548)  . heparin 1,300 Units/hr (09/30/16 1423)    Assessment: Pharmacy consulted to dose and monitor heparin in this 61 year old male admitted with incarcerated umbilical hernia. Patient was taking rivaroxaban PTA for atrial fibrillation and is currently NPO and being anticoagulated with heparin in anticipation of possible surgery.  Last dose of rivaroxaban PTA was 6/6  Goal of Therapy:  Heparin level 0.3-0.7 units/ml  Goal aPTT 66 - 102 sec Monitor platelets by anticoagulation protocol: Yes   Plan:  Will continue to dose off of APTT until APTT and HL correlate within therapeutic range.  APTT = 48 seconds (subtherapeutic) on heparin infusion rate of 1200 units/hr. Does not correlate with HL. Continue to dose off of APTT. Will increase infusion rate to 1300 units/hr and recheck APTT  in 6 hours.   CBC and HL to be checked again tomorrow morning.   6/7 1752 aPTT 64. Increase rate to 1400 units/hr. Will recheck aPTT and HL in 6 hours.  Laural Benes, Pharm.D., BCPS Clinical Pharmacist 09/30/2016,6:55 PM

## 2016-09-30 NOTE — Progress Notes (Signed)
[place] at [facility]                                                                                                                                                                                  Patient Demographics   Reginald Phillips, is a 61 y.o. male, DOB - Jul 13, 1955, RDE:[id number]  Admit date - 09/29/2016   Admitting Physician Jules Husbands, MD  Outpatient Primary MD for the patient is [name], [name], MD   LOS - 1  Subjective:  Patient feeling better has NG tube in place which still drainage   Review of Systems:   CONSTITUTIONAL: No documented fever. No fatigue, weakness. No weight gain, no weight loss.  EYES: No blurry or double vision.  ENT: No tinnitus. No postnasal drip. No redness of the oropharynx.  RESPIRATORY: No cough, no wheeze, no hemoptysis. No dyspnea.  CARDIOVASCULAR: No chest pain. No orthopnea. No palpitations. No syncope.  GASTROINTESTINAL: No nausea, no vomiting or diarrhea. Improved abdominal pain. No melena or hematochezia.  GENITOURINARY: No dysuria or hematuria.  ENDOCRINE: No polyuria or nocturia. No heat or cold intolerance.  HEMATOLOGY: No anemia. No bruising. No bleeding.  INTEGUMENTARY: No rashes. No lesions.  MUSCULOSKELETAL: No arthritis. No swelling. No gout.  NEUROLOGIC: No numbness, tingling, or ataxia. No seizure-type activity.  PSYCHIATRIC: No anxiety. No insomnia. No ADD.    Vitals:   Vitals:   09/29/16 2200 09/29/16 2245 09/30/16 0324 09/30/16 1210  BP: 123/65 125/62 112/66 116/73  Pulse: (!) 125 74 (!) 127 (!) 107  Resp: (!) 22 18 16    Temp:  97.9 F (36.6 C) 97.8 F (36.6 C) 98.2 F (36.8 C)  TempSrc:  Oral Oral Oral  SpO2: 95% 96% 97% 97%  Weight:   233 lb (105.7 kg)   Height:        Wt Readings from Last 3 Encounters:  09/30/16 233 lb (105.7 kg)     Intake/Output Summary (Last 24 hours) at 09/30/16 1342 Last data filed at 09/30/16 0721  Gross per 24 hour  Intake          1622.01 ml   Output              500 ml  Net          1122.01 ml    Physical Exam:   GENERAL: Pleasant-appearing in no apparent distress.  HEAD, EYES, EARS, NOSE AND THROAT: Atraumatic, normocephalic. Extraocular muscles are intact. Pupils equal and reactive to light. Sclerae anicteric. No conjunctival injection. No oro-pharyngeal erythema.  NECK: Supple. There is no jugular venous distention. No bruits, no lymphadenopathy, no thyromegaly.  HEART: Irregularly iregular. No murmurs,  no rubs, no clicks.  LUNGS: Clear to auscultation bilaterally. No rales or rhonchi. No wheezes.  ABDOMEN: Soft, no bowel sounds appreciated EXTREMITIES: No evidence of any cyanosis, clubbing, or peripheral edema.  +2 pedal and radial pulses bilaterally.  NEUROLOGIC: The patient is alert, awake, and oriented x3 with no focal motor or sensory deficits appreciated bilaterally.  SKIN: Moist and warm with no rashes appreciated.  Psych: Not anxious, depressed LN: No inguinal LN enlargement    Antibiotics   Anti-infectives    Start     Dose/Rate Route Frequency Ordered Stop   09/29/16 1915  piperacillin-tazobactam (ZOSYN) IVPB 3.375 g     3.375 g 100 mL/hr over 30 Minutes Intravenous  Once 09/29/16 1906 09/29/16 2022      Medications   Scheduled Meds: . benzocaine   Mouth/Throat Once  . digoxin  0.125 mg Intravenous Daily  . insulin aspart  0-20 Units Subcutaneous TID WC  . insulin aspart  0-5 Units Subcutaneous QHS  . insulin aspart  6 Units Subcutaneous TID WC  . metoprolol tartrate  5 mg Intravenous Q6H   Continuous Infusions: . diltiazem (CARDIZEM) infusion 5 mg/hr (09/30/16 0340)  . heparin 1,300 Units/hr (09/30/16 1230)  . lactated ringers 125 mL/hr at 09/30/16 0615   PRN Meds:.fentaNYL (SUBLIMAZE) injection, hydrALAZINE, morphine injection, ondansetron **OR** ondansetron (ZOFRAN) IV   Data Review:   Micro Results No results found for this or any previous visit (from the past 240 hour(s)).  Radiology  Reports Dg Abdomen 1 View  Result Date: 09/29/2016 CLINICAL DATA:  NG tube placement EXAM: ABDOMEN - 1 VIEW COMPARISON:  09/29/2016 FINDINGS: Lateral view of the abdomen demonstrates tip of the esophageal tube to project over proximal stomach. There are dilated loops of small bowel concerning foreign obstruction. There is contrast in the bladder. IMPRESSION: 1. Esophageal tube tip overlies the proximal stomach 2. Dilated loops of small bowel consistent with bowel obstruction Electronically Signed   By: Donavan Foil M.D.   On: 09/29/2016 21:26   Ct Abdomen Pelvis W Contrast  Result Date: 09/29/2016 CLINICAL DATA:  Pt reports sent here by [facility] for evaluation of bowel obstruction. Pt reports generalized abdominal pain, nausea and vomiting in the past two days. Pt reports last BM this morning but was very small. EXAM: CT ABDOMEN AND PELVIS WITH CONTRAST TECHNIQUE: Multidetector CT imaging of the abdomen and pelvis was performed using the standard protocol following bolus administration of intravenous contrast. CONTRAST:  194mL ISOVUE-300 IOPAMIDOL (ISOVUE-300) INJECTION 61% COMPARISON:  None. FINDINGS: Lower chest: No acute abnormality. Hepatobiliary: No focal liver abnormality is seen. No gallstones, gallbladder wall thickening, or biliary dilatation. Pancreas: Unremarkable. No pancreatic ductal dilatation or surrounding inflammatory changes. Spleen: Normal in size without focal abnormality. Adrenals/Urinary Tract: Normal adrenal glands. Small parapelvic cysts left greater than right. No hydronephrosis or focal renal lesion. Urinary bladder incompletely distended. Stomach/Bowel: Small hiatal hernia. The stomach is nondilated. The duodenum is decompressed. However, there are multiple dilated loops of proximal and mid small bowel. There is small bowel involvement into a large paraumbilical hernia, which represents transition point to decompressed distal ileum. No pneumatosis. No portal venous gas. The colon is  nondilated, unremarkable. Vascular/Lymphatic: Mild aortoiliac calcified plaque without aneurysm or stenosis. No adenopathy localized. Reproductive: Prostate is unremarkable. Other: No ascites.  No free air. Musculoskeletal: Minimal spurring in the lower thoracic spine. No fracture or worrisome bone lesion. IMPRESSION: 1. Moderately large paraumbilical hernia with small bowel involvement, causing distal small bowel obstruction. Electronically Signed  By: Lucrezia Europe M.D.   On: 09/29/2016 18:46   Dg Abd 2 Views  Result Date: 09/30/2016 CLINICAL DATA:  Small-bowel obstruction. EXAM: ABDOMEN - 2 VIEW COMPARISON:  09/29/2016.  CT 09/29/2016. FINDINGS: NG tube tip is in the proximal portion stomach. Advancement approximately 8 cm should be considered. Improvement of small bowel distention. Colonic gas pattern is normal. No free air. Contrast is in the bladder from recent CT. No acute bony abnormality identified. IMPRESSION: 1. NG tube tip noted in the proximal stomach. Advancement of approximately 8 cm should be considered. 2. Interim improvement of small bowel distention. Colonic gas pattern is normal. No free air . Electronically Signed   By: Marcello Moores  Register   On: 09/30/2016 08:13   Dg Abd Portable 1 View  Result Date: 09/29/2016 CLINICAL DATA:  Gastric tube placement EXAM: PORTABLE ABDOMEN - 1 VIEW COMPARISON:  Same day CT abdomen and pelvis FINDINGS: The end and side port of a gastric tube are seen below the left hemidiaphragm in the expected location of the proximal stomach. Dilated small bowel loops are seen and upper portion of large bowel. There is no pneumoperitoneum. There is cardiomegaly with atelectasis at the lung bases. No acute nor suspicious osseous abnormalities. IMPRESSION: Gastric tube in the expected location of the proximal stomach. Dilated small bowel loops are noted consistent with small bowel obstruction. Electronically Signed   By: Ashley Royalty M.D.   On: 09/29/2016 20:36      CBC  Recent Labs Lab 09/29/16 1629 09/30/16 0355  WBC 8.9 5.4  HGB 14.9 12.9*  HCT 44.4 38.9*  PLT 244 176  MCV 82.2 82.1  MCH 27.6 27.3  MCHC 33.6 33.3  RDW 14.8* 14.8*    Chemistries   Recent Labs Lab 09/29/16 1629 09/30/16 0355  NA 138 137  K 3.9 3.8  CL 100* 102  CO2 28 24  GLUCOSE 88 124*  BUN 22* 22*  CREATININE 1.32* 1.15  CALCIUM 10.2 8.5*  MG  --  1.5*  AST 24 17  ALT 10* 8*  ALKPHOS 48 39  BILITOT 1.0 1.2   ------------------------------------------------------------------------------------------------------------------ estimated creatinine clearance is 81.8 mL/min (by C-G formula based on SCr of 1.15 mg/dL). ------------------------------------------------------------------------------------------------------------------ No results for input(s): HGBA1C in the last 72 hours. ------------------------------------------------------------------------------------------------------------------ No results for input(s): CHOL, HDL, LDLCALC, TRIG, CHOLHDL, LDLDIRECT in the last 72 hours. ------------------------------------------------------------------------------------------------------------------ No results for input(s): TSH, T4TOTAL, T3FREE, THYROIDAB in the last 72 hours.  Invalid input(s): FREET3 ------------------------------------------------------------------------------------------------------------------ No results for input(s): VITAMINB12, FOLATE, FERRITIN, TIBC, IRON, RETICCTPCT in the last 72 hours.  Coagulation profile  Recent Labs Lab 09/30/16 0355  INR 1.32    No results for input(s): DDIMER in the last 72 hours.  Cardiac Enzymes No results for input(s): CKMB, TROPONINI, MYOGLOBIN in the last 168 hours.  Invalid input(s): CK ------------------------------------------------------------------------------------------------------------------ Invalid input(s): [place]  Patient is a 61 year old admitted with  incarcerated hernia and small bowel obstruction #1 incarcerated umbilical hernia: Admitted to surgery service,  Xarelto on hold currently on heparin 2. A. fib with RVR currently on IV Cardizem drip. Heart rate still elevated we'll give him a dose of digoxin pending. Continue metoprolol IV   3. Diabetes mellitus type 2:-Continue sliding scale insulin       Code Status Orders        Start     Ordered   09/29/16 2043  Full code  Continuous     09/29/16 2046    Code Status  History    Date Active Date Inactive Code Status Order ID Comments User Context   This patient has a current code status but no historical code status.            DVT Prophylaxis Heparin  Lab Results  Component Value Date   PLT 176 09/30/2016     Time Spent in minutes  68min Greater than 50% of time spent in care coordination and counseling patient regarding the condition and plan of care.   Dustin Flock M.D on 09/30/2016 at 1:42 PM  Between 7am to 6pm - Pager - [phone]  After 6pm go to [email or web address] - password [email or web address] [place] [facility]   Office  [phone]

## 2016-09-30 NOTE — Progress Notes (Signed)
SURGICAL PROGRESS NOTE (cpt [phone])  Hospital Day(s): 1.   Post op day(s):  Marland Kitchen   Interval History: Patient seen and examined, no acute events or new complaints overnight. Patient reports complete resolution of abdominal pain, +flatus, +BM, denies N/V, fever/chills, CP, or SOB. Patient reports lifting 150 lbs chicken feed bags 3 days prior to presenting at [facility] ED. After heavy lifting, hernia did not spontaneously reduce, and patient states he was not aware he could reduce it himself or that it not reducing could cause any problems. Patient expresses willingness to wear abdominal binder, and family states they'd rather him wear the abdominal binder and know to self-reduce his hernia than surgery if not necessary.  Review of Systems:  Constitutional: denies fever, chills  HEENT: denies cough or congestion  Respiratory: denies any shortness of breath  Cardiovascular: denies chest pain or palpitations  Gastrointestinal: abdominal pain, N/V, and bowel function as per interval history Genitourinary: denies burning with urination or urinary frequency Musculoskeletal: denies pain, decreased motor or sensation Integumentary: denies any other rashes or skin discolorations Neurological: denies HA or vision/hearing changes   Vital signs in last 24 hours: [min-max] current  Temp:  [97.7 F (36.5 C)-97.9 F (36.6 C)] 97.8 F (36.6 C) (06/07 0324) Pulse Rate:  [74-127] 127 (06/07 0324) Resp:  [16-26] 16 (06/07 0324) BP: (112-142)/(62-107) 112/66 (06/07 0324) SpO2:  [94 %-97 %] 97 % (06/07 0324) Weight:  [233 lb (105.7 kg)-238 lb (108 kg)] 233 lb (105.7 kg) (06/07 0324)     Height: 5' 10.5" (179.1 cm) Weight: 233 lb (105.7 kg) BMI (Calculated): 33.7   Intake/Output this shift:  Total I/O In: -  Out: 500 [Emesis/NG output:500]   Intake/Output last 2 shifts:  @IOLAST2SHIFTS @   Physical Exam:  Constitutional: alert, cooperative and no distress  HENT: normocephalic without obvious abnormality   Eyes: PERRL, EOM's grossly intact and symmetric  Neuro: CN II - XII grossly intact and symmetric without deficit  Respiratory: breathing non-labored at rest  Cardiovascular: regular rate and sinus rhythm  Gastrointestinal: soft, obese, completely non-tender, non-distended, easily reducible large defect umbilical hernia Musculoskeletal: UE and LE FROM, motor and sensation grossly intact, NT   Labs:  CBC Latest Ref Rng & Units 09/30/2016 09/29/2016  WBC 3.8 - 10.6 K/uL 5.4 8.9  Hemoglobin 13.0 - 18.0 g/dL 12.9(L) 14.9  Hematocrit 40.0 - 52.0 % 38.9(L) 44.4  Platelets 150 - 440 K/uL 176 244   CMP Latest Ref Rng & Units 09/30/2016 09/29/2016  Glucose 65 - 99 mg/dL 124(H) 88  BUN 6 - 20 mg/dL 22(H) 22(H)  Creatinine 0.61 - 1.24 mg/dL 1.15 1.32(H)  Sodium 135 - 145 mmol/L 137 138  Potassium 3.5 - 5.1 mmol/L 3.8 3.9  Chloride 101 - 111 mmol/L 102 100(L)  CO2 22 - 32 mmol/L 24 28  Calcium 8.9 - 10.3 mg/dL 8.5(L) 10.2  Total Protein 6.5 - 8.1 g/dL 6.2(L) 7.4  Total Bilirubin 0.3 - 1.2 mg/dL 1.2 1.0  Alkaline Phos 38 - 126 U/L 39 48  AST 15 - 41 U/L 17 24  ALT 17 - 63 U/L 8(L) 10(L)    Imaging studies:  Abdominal x-ray (09/30/2016) NG tube tip is in the proximal portion stomach. Advancement approximately 8 cm should be considered. Improvement of small bowel distention. Colonic gas pattern is normal. No free air. Contrast is in the bladder from recent CT. No acute bony abnormality identified.  Assessment/Plan: (ICD-10's: K42.0) 61 y.o. male with reduced chronic large-defect umbilical hernia, complicated chronic anticoagulation  for atrial fibrillation with RVR at time of presentation/admission and by pertinent comorbidities including morbid obesity (BMI 44), DM, and HTN.   - reassess NG tube drainage this afternoon   - discontinue NG tube if minimal drainage and ongoing flatus, benign abdominal exam   - medical management of comorbidities and anticoagulation as per medical team   - abdominal  binder advised, may consider outpatient elective surgical repair  All of the above findings and recommendations were discussed with the patient, patient's family, and the medical team, and all of patient's and family's questions were answered to his expressed satisfaction.  -- Marilynne Drivers Rosana Hoes, MD, [place]: [facility] - Partnering for exceptional care. Office: [phone]

## 2016-09-30 NOTE — Progress Notes (Signed)
New canister placed. Old canister had 215ml . I will continue to assess.

## 2016-09-30 NOTE — Progress Notes (Signed)
Order placed per MD order for abdominal binder. Materials notified, no binders in stock. Shipment due to be delivered in a.m. I will continue to assess.

## 2016-09-30 NOTE — Progress Notes (Signed)
Patient's HR sustaining in the mid to upper 60s. Notified MD Hugelmeyer and order to stop cardizem drip given. Nursing staff will continue to monitor for any changes in patient status. Earleen Reaper, RN

## 2016-09-30 NOTE — Discharge Instructions (Addendum)
See included general instructions for Umbilical Hernia  - EXCEPT it is okay to self-reduce your umbilical hernia as demonstrated.  - Follow up with surgeon as advised (call and schedule follow-up).  - Avoid constipation (stay hydrated and continue high fiber diet).  - No heavy lifting >15 lbs (children, pets, laundry, garbage) or strenuous activity to minimize hernia-associated risks, but light activity and walking are encouraged.  - Wear abdominal binder to minimize herniation and for comfort.  - Call surgeon or go to ED if unable to reduce >4 hours (not days).   Call office [phone]) at any time if any questions, worsening pain, fevers/chills, bleeding, drainage from incision site, or other concerns.

## 2016-09-30 NOTE — Progress Notes (Addendum)
ANTICOAGULATION CONSULT NOTE - Initial Consult  Pharmacy Consult for heparin Indication: atrial fibrillation  No Known Allergies  Patient Measurements: Height: 5' 10.5" (179.1 cm) Weight: 233 lb (105.7 kg) IBW/kg (Calculated) : 74.15 Heparin Dosing Weight: 97.3 kg  Vital Signs: Temp: 97.8 F (36.6 C) (06/07 0324) Temp Source: Oral (06/07 0324) BP: 112/66 (06/07 0324) Pulse Rate: 127 (06/07 0324)  Labs:  Recent Labs  09/29/16 1629 09/30/16 0355  HGB 14.9 12.9*  HCT 44.4 38.9*  PLT 244 176  APTT  --  75*  LABPROT  --  16.5*  INR  --  1.32  HEPARINUNFRC  --  1.19*  CREATININE 1.32* 1.15    Estimated Creatinine Clearance: 81.8 mL/min (by C-G formula based on SCr of 1.15 mg/dL).   Medical History: Past Medical History:  Diagnosis Date  . Diabetes mellitus without complication ([place])   . Hernia cerebri ([place])   . Hypertension     Medications:  Infusions:  . diltiazem (CARDIZEM) infusion 5 mg/hr (09/30/16 0340)  . heparin    . lactated ringers 125 mL/hr at 09/29/16 2301    Assessment: 61 yom cc abdominal pain and bowel obstruction. AF noted, pharmacy consulted to dose heparin for AF.   Goal of Therapy:  Heparin level 0.3-0.7 units/ml  Goal aPTT 66 - 108 sec Monitor platelets by anticoagulation protocol: Yes   Plan:  Give 5000 units bolus x 1 Start heparin infusion at 1200 units/hr Check anti-Xa level in 6 hours and daily while on heparin Continue to monitor H&H and platelets   6/7 @ 0400 HL 1.19, aPTT 75. APTT currently therapeutic.  Since HL/aPTT are not correlating will dose off aPTT.  Will dose based off of HL once both HL and aPTT correlate.  Will draw next HL/aPTT @ 1000 Patient takes rivaroxaban 20 mg in the evening at home.  Thanks  Tobie Lords, Pharm.D., BCPS Clinical Pharmacist 09/30/2016,5:47 AM

## 2016-09-30 NOTE — Progress Notes (Signed)
ANTICOAGULATION CONSULT NOTE - Initial Consult  Pharmacy Consult for heparin Indication: atrial fibrillation  No Known Allergies  Patient Measurements: Height: 5' 10.5" (179.1 cm) Weight: 233 lb (105.7 kg) IBW/kg (Calculated) : 74.15 Heparin Dosing Weight: 97 kg  Vital Signs: Temp: 97.8 F (36.6 C) (06/07 0324) Temp Source: Oral (06/07 0324) BP: 112/66 (06/07 0324) Pulse Rate: 127 (06/07 0324)  Labs:  Recent Labs  09/29/16 1629 09/30/16 0355 09/30/16 0943  HGB 14.9 12.9*  --   HCT 44.4 38.9*  --   PLT 244 176  --   APTT  --  75* 48*  LABPROT  --  16.5*  --   INR  --  1.32  --   HEPARINUNFRC  --  1.19* 0.42  CREATININE 1.32* 1.15  --     Estimated Creatinine Clearance: 81.8 mL/min (by C-G formula based on SCr of 1.15 mg/dL).  Medical History: Past Medical History:  Diagnosis Date  . Diabetes mellitus without complication ([place])   . Hernia cerebri ([place])   . Hypertension    Medications:  Infusions:  . diltiazem (CARDIZEM) infusion 5 mg/hr (09/30/16 0340)  . heparin    . lactated ringers 125 mL/hr at 09/30/16 0615  . magnesium sulfate 1 - 4 g bolus IVPB      Assessment: Pharmacy consulted to dose and monitor heparin in this 61 year old male admitted with incarcerated umbilical hernia. Patient was taking rivaroxaban PTA for atrial fibrillation and is currently NPO and being anticoagulated with heparin in anticipation of possible surgery.  Last dose of rivaroxaban PTA was 6/6  Goal of Therapy:  Heparin level 0.3-0.7 units/ml  Goal aPTT 66 - 102 sec Monitor platelets by anticoagulation protocol: Yes   Plan:  Will continue to dose off of APTT until APTT and HL correlate within therapeutic range.  APTT = 48 seconds (subtherapeutic) on heparin infusion rate of 1200 units/hr. Does not correlate with HL. Continue to dose off of APTT. Will increase infusion rate to 1300 units/hr and recheck APTT in 6 hours.   CBC and HL to be checked again tomorrow morning.    Lenis Noon, Pharm.D., BCPS Clinical Pharmacist 09/30/2016,10:34 AM

## 2016-09-30 NOTE — Plan of Care (Signed)
Problem: Safety: Goal: Ability to remain free from injury will improve Outcome: Progressing Pt encouraged to call for assistance with activity.

## 2016-09-30 NOTE — Care Management (Signed)
Incarcerated hernia which is going to require surgical intervention but found to be in atrial fib with rvr. Atrial fib is chronic.  On chronic Xarelto. NPO with nasogastric tube to low intermittent suction. Independent in all adls, denies issues accessing medical care, obtaining medications or with transportation.

## 2016-10-01 LAB — CBC
HCT: 39.4 % — ABNORMAL LOW (ref 40.0–52.0)
HEMOGLOBIN: 13.3 g/dL (ref 13.0–18.0)
MCH: 27.5 pg (ref 26.0–34.0)
MCHC: 33.9 g/dL (ref 32.0–36.0)
MCV: 81.3 fL (ref 80.0–100.0)
Platelets: 189 10*3/uL (ref 150–440)
RBC: 4.85 MIL/uL (ref 4.40–5.90)
RDW: 14.4 % (ref 11.5–14.5)
WBC: 7 10*3/uL (ref 3.8–10.6)

## 2016-10-01 LAB — BASIC METABOLIC PANEL
ANION GAP: 7 (ref 5–15)
BUN: 15 mg/dL (ref 6–20)
CHLORIDE: 100 mmol/L — AB (ref 101–111)
CO2: 28 mmol/L (ref 22–32)
Calcium: 7.8 mg/dL — ABNORMAL LOW (ref 8.9–10.3)
Creatinine, Ser: 1.03 mg/dL (ref 0.61–1.24)
GFR calc non Af Amer: >60 mL/min (ref >60)
Glucose, Bld: 190 mg/dL — ABNORMAL HIGH (ref 65–99)
POTASSIUM: 3.7 mmol/L (ref 3.5–5.1)
Sodium: 135 mmol/L (ref 135–145)

## 2016-10-01 LAB — HIV ANTIBODY (ROUTINE TESTING W REFLEX): HIV SCREEN 4TH GENERATION: NONREACTIVE

## 2016-10-01 LAB — GLUCOSE, CAPILLARY
GLUCOSE-CAPILLARY: 203 mg/dL — AB (ref 65–99)
GLUCOSE-CAPILLARY: 184 mg/dL — AB (ref 65–99)
GLUCOSE-CAPILLARY: 148 mg/dL — AB (ref 65–99)
GLUCOSE-CAPILLARY: 207 mg/dL — AB (ref 65–99)

## 2016-10-01 LAB — HEPARIN LEVEL (UNFRACTIONATED)
Heparin Unfractionated: 0.3 IU/mL (ref 0.30–0.70)
Heparin Unfractionated: 0.27 IU/mL — ABNORMAL LOW (ref 0.30–0.70)

## 2016-10-01 LAB — APTT
APTT: 78 s — AB (ref 24–36)
APTT: 64 s — AB (ref 24–36)

## 2016-10-01 LAB — MAGNESIUM: Magnesium: 1.6 mg/dL — ABNORMAL LOW (ref 1.7–2.4)

## 2016-10-01 MED ORDER — HEPARIN BOLUS VIA INFUSION
1400.0000 [IU] | Freq: Once | INTRAVENOUS | Status: AC
  Administered 2016-10-01: 1400 [IU] via INTRAVENOUS
  Filled 2016-10-01: qty 1400

## 2016-10-01 MED ORDER — MAGNESIUM SULFATE 2 GM/50ML IV SOLN
2.0000 g | Freq: Once | INTRAVENOUS | Status: AC
  Administered 2016-10-01: 2 g via INTRAVENOUS
  Filled 2016-10-01: qty 50

## 2016-10-01 MED ORDER — METOPROLOL TARTRATE 25 MG PO TABS
25.0000 mg | ORAL_TABLET | Freq: Two times a day (BID) | ORAL | Status: DC
  Administered 2016-10-01 – 2016-10-02 (×3): 25 mg via ORAL
  Filled 2016-10-01 (×3): qty 1

## 2016-10-01 MED ORDER — HEPARIN (PORCINE) IN NACL 100-0.45 UNIT/ML-% IJ SOLN
1500.0000 [IU]/h | INTRAMUSCULAR | Status: DC
  Administered 2016-10-01: 1500 [IU]/h via INTRAVENOUS
  Filled 2016-10-01: qty 250

## 2016-10-01 MED ORDER — RIVAROXABAN 20 MG PO TABS
20.0000 mg | ORAL_TABLET | Freq: Every day | ORAL | Status: DC
  Administered 2016-10-01 – 2016-10-02 (×2): 20 mg via ORAL
  Filled 2016-10-01 (×2): qty 1

## 2016-10-01 NOTE — Progress Notes (Signed)
[facility] Cardiology  SUBJECTIVE: I don't have chest pain   Vitals:   09/29/16 2245 09/30/16 0324 09/30/16 1210 09/30/16 1932  BP: 125/62 112/66 116/73 129/67  Pulse: 74 (!) 127 (!) 107 86  Resp: 18 16  18   Temp: 97.9 F (36.6 C) 97.8 F (36.6 C) 98.2 F (36.8 C) 98.8 F (37.1 C)  TempSrc: Oral Oral Oral Oral  SpO2: 96% 97% 97% 94%  Weight:  105.7 kg (233 lb)    Height:         Intake/Output Summary (Last 24 hours) at 10/01/16 0801 Last data filed at 10/01/16 5956  Gross per 24 hour  Intake          1626.55 ml  Output              200 ml  Net          1426.55 ml      PHYSICAL EXAM  General: Well developed, well nourished, in no acute distress HEENT:  Normocephalic and atramatic Neck:  No JVD.  Lungs: Clear bilaterally to auscultation and percussion. Heart: Irregularly irregular rhythm. Normal S1 and S2 without gallops or murmurs.  Abdomen: Bowel sounds are positive, abdomen soft and non-tender  Msk:  Back normal, normal gait. Normal strength and tone for age. Extremities: No clubbing, cyanosis or edema.   Neuro: Alert and oriented X 3. Psych:  Good affect, responds appropriately   LABS: Basic Metabolic Panel:  Recent Labs  09/30/16 0355 10/01/16 0108  NA 137 135  K 3.8 3.7  CL 102 100*  CO2 24 28  GLUCOSE 124* 190*  BUN 22* 15  CREATININE 1.15 1.03  CALCIUM 8.5* 7.8*  MG 1.5*  --    Liver Function Tests:  Recent Labs  09/29/16 1629 09/30/16 0355  AST 24 17  ALT 10* 8*  ALKPHOS 48 39  BILITOT 1.0 1.2  PROT 7.4 6.2*  ALBUMIN 4.4 3.4*    Recent Labs  09/29/16 1629  LIPASE 21   CBC:  Recent Labs  09/30/16 0355 10/01/16 0108  WBC 5.4 7.0  HGB 12.9* 13.3  HCT 38.9* 39.4*  MCV 82.1 81.3  PLT 176 189   Cardiac Enzymes: No results for input(s): CKTOTAL, CKMB, CKMBINDEX, TROPONINI in the last 72 hours. BNP: Invalid input(s): POCBNP D-Dimer: No results for input(s): DDIMER in the last 72 hours. Hemoglobin A1C: No results for input(s):  HGBA1C in the last 72 hours. Fasting Lipid Panel: No results for input(s): CHOL, HDL, LDLCALC, TRIG, CHOLHDL, LDLDIRECT in the last 72 hours. Thyroid Function Tests: No results for input(s): TSH, T4TOTAL, T3FREE, THYROIDAB in the last 72 hours.  Invalid input(s): FREET3 Anemia Panel: No results for input(s): VITAMINB12, FOLATE, FERRITIN, TIBC, IRON, RETICCTPCT in the last 72 hours.  Dg Abdomen 1 View  Result Date: 09/29/2016 CLINICAL DATA:  NG tube placement EXAM: ABDOMEN - 1 VIEW COMPARISON:  09/29/2016 FINDINGS: Lateral view of the abdomen demonstrates tip of the esophageal tube to project over proximal stomach. There are dilated loops of small bowel concerning foreign obstruction. There is contrast in the bladder. IMPRESSION: 1. Esophageal tube tip overlies the proximal stomach 2. Dilated loops of small bowel consistent with bowel obstruction Electronically Signed   By: Donavan Foil M.D.   On: 09/29/2016 21:26   Ct Abdomen Pelvis W Contrast  Result Date: 09/29/2016 CLINICAL DATA:  Pt reports sent here by [facility] [place] for evaluation of bowel obstruction. Pt reports generalized abdominal pain, nausea and vomiting in the past two  days. Pt reports last BM this morning but was very small. EXAM: CT ABDOMEN AND PELVIS WITH CONTRAST TECHNIQUE: Multidetector CT imaging of the abdomen and pelvis was performed using the standard protocol following bolus administration of intravenous contrast. CONTRAST:  127mL ISOVUE-300 IOPAMIDOL (ISOVUE-300) INJECTION 61% COMPARISON:  None. FINDINGS: Lower chest: No acute abnormality. Hepatobiliary: No focal liver abnormality is seen. No gallstones, gallbladder wall thickening, or biliary dilatation. Pancreas: Unremarkable. No pancreatic ductal dilatation or surrounding inflammatory changes. Spleen: Normal in size without focal abnormality. Adrenals/Urinary Tract: Normal adrenal glands. Small parapelvic cysts left greater than right. No hydronephrosis or focal renal lesion.  Urinary bladder incompletely distended. Stomach/Bowel: Small hiatal hernia. The stomach is nondilated. The duodenum is decompressed. However, there are multiple dilated loops of proximal and mid small bowel. There is small bowel involvement into a large paraumbilical hernia, which represents transition point to decompressed distal ileum. No pneumatosis. No portal venous gas. The colon is nondilated, unremarkable. Vascular/Lymphatic: Mild aortoiliac calcified plaque without aneurysm or stenosis. No adenopathy localized. Reproductive: Prostate is unremarkable. Other: No ascites.  No free air. Musculoskeletal: Minimal spurring in the lower thoracic spine. No fracture or worrisome bone lesion. IMPRESSION: 1. Moderately large paraumbilical hernia with small bowel involvement, causing distal small bowel obstruction. Electronically Signed   By: Lucrezia Europe M.D.   On: 09/29/2016 18:46   Dg Abd 2 Views  Result Date: 09/30/2016 CLINICAL DATA:  Small-bowel obstruction. EXAM: ABDOMEN - 2 VIEW COMPARISON:  09/29/2016.  CT 09/29/2016. FINDINGS: NG tube tip is in the proximal portion stomach. Advancement approximately 8 cm should be considered. Improvement of small bowel distention. Colonic gas pattern is normal. No free air. Contrast is in the bladder from recent CT. No acute bony abnormality identified. IMPRESSION: 1. NG tube tip noted in the proximal stomach. Advancement of approximately 8 cm should be considered. 2. Interim improvement of small bowel distention. Colonic gas pattern is normal. No free air . Electronically Signed   By: Marcello Moores  Register   On: 09/30/2016 08:13   Dg Abd Portable 1 View  Result Date: 09/29/2016 CLINICAL DATA:  Gastric tube placement EXAM: PORTABLE ABDOMEN - 1 VIEW COMPARISON:  Same day CT abdomen and pelvis FINDINGS: The end and side port of a gastric tube are seen below the left hemidiaphragm in the expected location of the proximal stomach. Dilated small bowel loops are seen and upper portion  of large bowel. There is no pneumoperitoneum. There is cardiomegaly with atelectasis at the lung bases. No acute nor suspicious osseous abnormalities. IMPRESSION: Gastric tube in the expected location of the proximal stomach. Dilated small bowel loops are noted consistent with small bowel obstruction. Electronically Signed   By: Ashley Royalty M.D.   On: 09/29/2016 20:36     Echo  TELEMETRY: Atrial fibrillation rate of 72 BPM:  ASSESSMENT AND PLAN:  Active Problems:   Incarcerated hernia    1. Atrial fibrillation with controlled ventricular rate, on Cardizem drip at low rate, intermittent intravenous digoxin and metoprolol tartrate, and heparin drip for stroke prevention, appears to be medically optimized 2. Reduced incarcerated umbilical hernia, awaiting semi-urgent surgical repair during this hospitalization  Recommendations  1. Continue current medications 2. Start metoprolol titrate 25 mg by mouth twice a day when patient able to tolerate by mouth 3. Continue heparin drip for now, resume Xarelto following surgery   Isaias Cowman, MD, PhD, [facility] 10/01/2016 8:01 AM

## 2016-10-01 NOTE — Progress Notes (Signed)
ANTICOAGULATION CONSULT NOTE - Initial Consult  Pharmacy Consult for heparin Indication: atrial fibrillation  No Known Allergies  Patient Measurements: Height: 5' 10.5" (179.1 cm) Weight: 233 lb (105.7 kg) IBW/kg (Calculated) : 74.15 Heparin Dosing Weight: 97 kg  Vital Signs: Temp: 98.8 F (37.1 C) (06/07 1932) Temp Source: Oral (06/07 1932) BP: 129/67 (06/07 1932) Pulse Rate: 86 (06/07 1932)  Labs:  Recent Labs  09/29/16 1629  09/30/16 0355 09/30/16 0943 09/30/16 1752 10/01/16 0106 10/01/16 0108  HGB 14.9  --  12.9*  --   --   --  13.3  HCT 44.4  --  38.9*  --   --   --  39.4*  PLT 244  --  176  --   --   --  189  APTT  --   < > 75* 48* 64* 78*  --   LABPROT  --   --  16.5*  --   --   --   --   INR  --   --  1.32  --   --   --   --   HEPARINUNFRC  --   --  1.19* 0.42  --  0.30  --   CREATININE 1.32*  --  1.15  --   --   --  1.03  < > = values in this interval not displayed.  Estimated Creatinine Clearance: 91.3 mL/min (by C-G formula based on SCr of 1.03 mg/dL).  Medical History: Past Medical History:  Diagnosis Date  . Diabetes mellitus without complication ([place])   . Hernia cerebri ([place])   . Hypertension    Medications:  Infusions:  . dextrose 5 % and 0.45 % NaCl with KCl 20 mEq/L 75 mL/hr at 09/30/16 1811  . diltiazem (CARDIZEM) infusion 5 mg/hr (09/30/16 1548)  . heparin 1,400 Units/hr (09/30/16 1916)    Assessment: Pharmacy consulted to dose and monitor heparin in this 61 year old male admitted with incarcerated umbilical hernia. Patient was taking rivaroxaban PTA for atrial fibrillation and is currently NPO and being anticoagulated with heparin in anticipation of possible surgery.  Last dose of rivaroxaban PTA was 6/6  Goal of Therapy:  Heparin level 0.3-0.7 units/ml  Goal aPTT 66 - 102 sec Monitor platelets by anticoagulation protocol: Yes   Plan:  Will continue to dose off of APTT until APTT and HL correlate within therapeutic range.  APTT  = 48 seconds (subtherapeutic) on heparin infusion rate of 1200 units/hr. Does not correlate with HL. Continue to dose off of APTT. Will increase infusion rate to 1300 units/hr and recheck APTT in 6 hours.   CBC and HL to be checked again tomorrow morning.   6/7 1752 aPTT 64. Increase rate to 1400 units/hr. Will recheck aPTT and HL in 6 hours.  6/8 @ 0100 HL 0.30, aPTT 78 therapeutic. HL/aPTT slightly correlating, will draw HL/aPTT @ 0700.  Tobie Lords, Pharm.D., BCPS Clinical Pharmacist 10/01/2016,2:51 AM

## 2016-10-01 NOTE — Plan of Care (Signed)
Problem: Activity: Goal: Risk for activity intolerance will decrease Outcome: Progressing Pt encouraged to be OOB to chair each shift.

## 2016-10-01 NOTE — Progress Notes (Signed)
ANTICOAGULATION CONSULT NOTE - Initial Consult  Pharmacy Consult for heparin Indication: atrial fibrillation  No Known Allergies  Patient Measurements: Height: 5' 10.5" (179.1 cm) Weight: 233 lb (105.7 kg) IBW/kg (Calculated) : 74.15 Heparin Dosing Weight: 97 kg  Vital Signs: BP: 117/78 (06/08 0843) Pulse Rate: 75 (06/08 0843)  Labs:  Recent Labs  09/29/16 1629  09/30/16 0355 09/30/16 0943 09/30/16 1752 10/01/16 0106 10/01/16 0108 10/01/16 0708  HGB 14.9  --  12.9*  --   --   --  13.3  --   HCT 44.4  --  38.9*  --   --   --  39.4*  --   PLT 244  --  176  --   --   --  189  --   APTT  --   < > 75* 48* 64* 78*  --  64*  LABPROT  --   --  16.5*  --   --   --   --   --   INR  --   --  1.32  --   --   --   --   --   HEPARINUNFRC  --   < > 1.19* 0.42  --  0.30  --  0.27*  CREATININE 1.32*  --  1.15  --   --   --  1.03  --   < > = values in this interval not displayed.  Estimated Creatinine Clearance: 91.3 mL/min (by C-G formula based on SCr of 1.03 mg/dL).  Medical History: Past Medical History:  Diagnosis Date  . Diabetes mellitus without complication ([place])   . Hernia cerebri ([place])   . Hypertension    Medications:  Infusions:  . dextrose 5 % and 0.45 % NaCl with KCl 20 mEq/L 75 mL/hr at 09/30/16 1811  . heparin      Assessment: Pharmacy consulted to dose and monitor heparin in this 61 year old male admitted with incarcerated umbilical hernia. Patient was taking rivaroxaban PTA for atrial fibrillation and is currently NPO and being anticoagulated with heparin in anticipation of possible surgery.  Last dose of rivaroxaban PTA was 6/6  Goal of Therapy:  Heparin level 0.3-0.7 units/ml  Goal aPTT 66 - 102 sec Monitor platelets by anticoagulation protocol: Yes   Plan:  HL and APTT correlated within therapeutic range on 6/8 at 0106 AM, so will transition to monitoring/dosing off of HL per protocol.  HL = 0.27 is slightly subtherapeutic this morning on a rate of  1400 units/hr. Will give 1400 unit (15 unit/kg) bolus and increase infusion rate to 1500 units/hr. Will recheck HL in 6 hours. CBC to be checked daily.  Lenis Noon, Pharm.D., BCPS Clinical Pharmacist 10/01/2016,8:45 AM

## 2016-10-01 NOTE — Progress Notes (Signed)
[place] at [facility]                                                                                                                                                                                  Patient Demographics   Reginald Phillips, is a 61 y.o. male, DOB - 1956/04/23, JKD:[id number]  Admit date - 09/29/2016   Admitting Physician Jules Husbands, MD  Outpatient Primary MD for the patient is [name], [name], MD   LOS - 2  Subjective:  Feeling much better on clear liquid diet no abdomial pain   Review of Systems:   CONSTITUTIONAL: No documented fever. No fatigue, weakness. No weight gain, no weight loss.  EYES: No blurry or double vision.  ENT: No tinnitus. No postnasal drip. No redness of the oropharynx.  RESPIRATORY: No cough, no wheeze, no hemoptysis. No dyspnea.  CARDIOVASCULAR: No chest pain. No orthopnea. No palpitations. No syncope.  GASTROINTESTINAL: No nausea, no vomiting or diarrhea. Improved abdominal pain. No melena or hematochezia.  GENITOURINARY: No dysuria or hematuria.  ENDOCRINE: No polyuria or nocturia. No heat or cold intolerance.  HEMATOLOGY: No anemia. No bruising. No bleeding.  INTEGUMENTARY: No rashes. No lesions.  MUSCULOSKELETAL: No arthritis. No swelling. No gout.  NEUROLOGIC: No numbness, tingling, or ataxia. No seizure-type activity.  PSYCHIATRIC: No anxiety. No insomnia. No ADD.    Vitals:   Vitals:   09/30/16 0324 09/30/16 1210 09/30/16 1932 10/01/16 0843  BP: 112/66 116/73 129/67 117/78  Pulse: (!) 127 (!) 107 86 75  Resp: 16  18 16   Temp: 97.8 F (36.6 C) 98.2 F (36.8 C) 98.8 F (37.1 C)   TempSrc: Oral Oral Oral   SpO2: 97% 97% 94% 98%  Weight: 233 lb (105.7 kg)     Height:        Wt Readings from Last 3 Encounters:  09/30/16 233 lb (105.7 kg)     Intake/Output Summary (Last 24 hours) at 10/01/16 1206 Last data filed at 10/01/16 0900  Gross per 24 hour  Intake          2286.55 ml  Output                 0 ml  Net          2286.55 ml    Physical Exam:   GENERAL: Pleasant-appearing in no apparent distress.  HEAD, EYES, EARS, NOSE AND THROAT: Atraumatic, normocephalic. Extraocular muscles are intact. Pupils equal and reactive to light. Sclerae anicteric. No conjunctival injection. No oro-pharyngeal erythema.  NECK: Supple. There is no jugular venous distention. No bruits, no lymphadenopathy, no thyromegaly.  HEART: Irregularly iregular. No murmurs, no  rubs, no clicks.  LUNGS: Clear to auscultation bilaterally. No rales or rhonchi. No wheezes.  ABDOMEN: Soft, improved bs EXTREMITIES: No evidence of any cyanosis, clubbing, or peripheral edema.  +2 pedal and radial pulses bilaterally.  NEUROLOGIC: The patient is alert, awake, and oriented x3 with no focal motor or sensory deficits appreciated bilaterally.  SKIN: Moist and warm with no rashes appreciated.  Psych: Not anxious, depressed LN: No inguinal LN enlargement    Antibiotics   Anti-infectives    Start     Dose/Rate Route Frequency Ordered Stop   09/29/16 1915  piperacillin-tazobactam (ZOSYN) IVPB 3.375 g     3.375 g 100 mL/hr over 30 Minutes Intravenous  Once 09/29/16 1906 09/29/16 2022      Medications   Scheduled Meds: . digoxin  0.125 mg Intravenous Daily  . insulin aspart  0-20 Units Subcutaneous TID WC  . insulin aspart  0-5 Units Subcutaneous QHS  . insulin aspart  6 Units Subcutaneous TID WC  . metoprolol tartrate  25 mg Oral BID  . rivaroxaban  20 mg Oral Daily   Continuous Infusions: . dextrose 5 % and 0.45 % NaCl with KCl 20 mEq/L 75 mL/hr at 10/01/16 1035   PRN Meds:.hydrALAZINE, morphine injection, ondansetron **OR** ondansetron (ZOFRAN) IV   Data Review:   Micro Results No results found for this or any previous visit (from the past 240 hour(s)).  Radiology Reports Dg Abdomen 1 View  Result Date: 09/29/2016 CLINICAL DATA:  NG tube placement EXAM: ABDOMEN - 1 VIEW COMPARISON:  09/29/2016 FINDINGS:  Lateral view of the abdomen demonstrates tip of the esophageal tube to project over proximal stomach. There are dilated loops of small bowel concerning foreign obstruction. There is contrast in the bladder. IMPRESSION: 1. Esophageal tube tip overlies the proximal stomach 2. Dilated loops of small bowel consistent with bowel obstruction Electronically Signed   By: Donavan Foil M.D.   On: 09/29/2016 21:26   Ct Abdomen Pelvis W Contrast  Result Date: 09/29/2016 CLINICAL DATA:  Pt reports sent here by [facility] [place] for evaluation of bowel obstruction. Pt reports generalized abdominal pain, nausea and vomiting in the past two days. Pt reports last BM this morning but was very small. EXAM: CT ABDOMEN AND PELVIS WITH CONTRAST TECHNIQUE: Multidetector CT imaging of the abdomen and pelvis was performed using the standard protocol following bolus administration of intravenous contrast. CONTRAST:  162mL ISOVUE-300 IOPAMIDOL (ISOVUE-300) INJECTION 61% COMPARISON:  None. FINDINGS: Lower chest: No acute abnormality. Hepatobiliary: No focal liver abnormality is seen. No gallstones, gallbladder wall thickening, or biliary dilatation. Pancreas: Unremarkable. No pancreatic ductal dilatation or surrounding inflammatory changes. Spleen: Normal in size without focal abnormality. Adrenals/Urinary Tract: Normal adrenal glands. Small parapelvic cysts left greater than right. No hydronephrosis or focal renal lesion. Urinary bladder incompletely distended. Stomach/Bowel: Small hiatal hernia. The stomach is nondilated. The duodenum is decompressed. However, there are multiple dilated loops of proximal and mid small bowel. There is small bowel involvement into a large paraumbilical hernia, which represents transition point to decompressed distal ileum. No pneumatosis. No portal venous gas. The colon is nondilated, unremarkable. Vascular/Lymphatic: Mild aortoiliac calcified plaque without aneurysm or stenosis. No adenopathy localized.  Reproductive: Prostate is unremarkable. Other: No ascites.  No free air. Musculoskeletal: Minimal spurring in the lower thoracic spine. No fracture or worrisome bone lesion. IMPRESSION: 1. Moderately large paraumbilical hernia with small bowel involvement, causing distal small bowel obstruction. Electronically Signed   By: Lucrezia Europe M.D.   On: 09/29/2016 18:46  Dg Abd 2 Views  Result Date: 09/30/2016 CLINICAL DATA:  Small-bowel obstruction. EXAM: ABDOMEN - 2 VIEW COMPARISON:  09/29/2016.  CT 09/29/2016. FINDINGS: NG tube tip is in the proximal portion stomach. Advancement approximately 8 cm should be considered. Improvement of small bowel distention. Colonic gas pattern is normal. No free air. Contrast is in the bladder from recent CT. No acute bony abnormality identified. IMPRESSION: 1. NG tube tip noted in the proximal stomach. Advancement of approximately 8 cm should be considered. 2. Interim improvement of small bowel distention. Colonic gas pattern is normal. No free air . Electronically Signed   By: Marcello Moores  Register   On: 09/30/2016 08:13   Dg Abd Portable 1 View  Result Date: 09/29/2016 CLINICAL DATA:  Gastric tube placement EXAM: PORTABLE ABDOMEN - 1 VIEW COMPARISON:  Same day CT abdomen and pelvis FINDINGS: The end and side port of a gastric tube are seen below the left hemidiaphragm in the expected location of the proximal stomach. Dilated small bowel loops are seen and upper portion of large bowel. There is no pneumoperitoneum. There is cardiomegaly with atelectasis at the lung bases. No acute nor suspicious osseous abnormalities. IMPRESSION: Gastric tube in the expected location of the proximal stomach. Dilated small bowel loops are noted consistent with small bowel obstruction. Electronically Signed   By: Ashley Royalty M.D.   On: 09/29/2016 20:36     CBC  Recent Labs Lab 09/29/16 1629 09/30/16 0355 10/01/16 0108  WBC 8.9 5.4 7.0  HGB 14.9 12.9* 13.3  HCT 44.4 38.9* 39.4*  PLT 244 176  189  MCV 82.2 82.1 81.3  MCH 27.6 27.3 27.5  MCHC 33.6 33.3 33.9  RDW 14.8* 14.8* 14.4    Chemistries   Recent Labs Lab 09/29/16 1629 09/30/16 0355 10/01/16 0108  NA 138 137 135  K 3.9 3.8 3.7  CL 100* 102 100*  CO2 28 24 28   GLUCOSE 88 124* 190*  BUN 22* 22* 15  CREATININE 1.32* 1.15 1.03  CALCIUM 10.2 8.5* 7.8*  MG  --  1.5* 1.6*  AST 24 17  --   ALT 10* 8*  --   ALKPHOS 48 39  --   BILITOT 1.0 1.2  --    ------------------------------------------------------------------------------------------------------------------ estimated creatinine clearance is 91.3 mL/min (by C-G formula based on SCr of 1.03 mg/dL). ------------------------------------------------------------------------------------------------------------------ No results for input(s): HGBA1C in the last 72 hours. ------------------------------------------------------------------------------------------------------------------ No results for input(s): CHOL, HDL, LDLCALC, TRIG, CHOLHDL, LDLDIRECT in the last 72 hours. ------------------------------------------------------------------------------------------------------------------ No results for input(s): TSH, T4TOTAL, T3FREE, THYROIDAB in the last 72 hours.  Invalid input(s): FREET3 ------------------------------------------------------------------------------------------------------------------ No results for input(s): VITAMINB12, FOLATE, FERRITIN, TIBC, IRON, RETICCTPCT in the last 72 hours.  Coagulation profile  Recent Labs Lab 09/30/16 0355  INR 1.32    No results for input(s): DDIMER in the last 72 hours.  Cardiac Enzymes No results for input(s): CKMB, TROPONINI, MYOGLOBIN in the last 168 hours.  Invalid input(s): CK ------------------------------------------------------------------------------------------------------------------ Invalid input(s): [place]  Patient is a 61 year old admitted with incarcerated hernia and  small bowel obstruction #1 incarcerated umbilical hernia: Admitted to surgery service, ngt out, no plan for surgery Xarelto on hold currently on heparin 2. A. fib with RVR  D/c cardizem drip, oral metoprlol, can d/c heaprin resume xarlto on discharge 3. Diabetes mellitus type 2:-Continue sliding scale insulin resume home meds on d/c  4. hypertention restart home meds on d/c   Ok to d/c from medical standpoint  Code Status Orders        Start     Ordered   09/29/16 2043  Full code  Continuous     09/29/16 2046    Code Status History    Date Active Date Inactive Code Status Order ID Comments User Context   This patient has a current code status but no historical code status.            DVT Prophylaxis Heparin  Lab Results  Component Value Date   PLT 189 10/01/2016     Time Spent in minutes  31min Greater than 50% of time spent in care coordination and counseling patient regarding the condition and plan of care.   Dustin Flock M.D on 10/01/2016 at 12:06 PM  Between 7am to 6pm - Pager - [phone]  After 6pm go to [email or web address] - password [email or web address] [place] [facility]   Office  [phone]

## 2016-10-01 NOTE — Progress Notes (Signed)
SURGICAL PROGRESS NOTE (cpt [phone])  Hospital Day(s): 2.   Post op day(s):  Reginald Phillips   Interval History: Patient seen and examined, no acute events or new complaints overnight. Patient reports +flatus and +BM with ambulation and without abdominal pain or N/V, denies fever/chills, CP, or SOB.  Review of Systems:  Constitutional: denies fever, chills  HEENT: denies cough or congestion  Respiratory: denies any shortness of breath  Cardiovascular: denies chest pain or palpitations  Gastrointestinal: abdominal pain, N/V, and bowel function as per interval history Genitourinary: denies burning with urination or urinary frequency Musculoskeletal: denies pain, decreased motor or sensation Integumentary: denies any other rashes or skin discolorations Neurological: denies HA or vision/hearing changes   Vital signs in last 24 hours: [min-max] current  Temp:  [98.2 F (36.8 C)-98.8 F (37.1 C)] 98.8 F (37.1 C) (06/07 1932) Pulse Rate:  [86-107] 86 (06/07 1932) Resp:  [18] 18 (06/07 1932) BP: (116-129)/(67-73) 129/67 (06/07 1932) SpO2:  [94 %-97 %] 94 % (06/07 1932)     Height: 5' 10.5" (179.1 cm) Weight: 233 lb (105.7 kg) BMI (Calculated): 33.7   Intake/Output this shift:  No intake/output data recorded.   Intake/Output last 2 shifts:  @IOLAST2SHIFTS @   Physical Exam:  Constitutional: alert, cooperative and no distress  HENT: normocephalic without obvious abnormality  Eyes: PERRL, EOM's grossly intact and symmetric  Neuro: CN II - XII grossly intact and symmetric without deficit  Respiratory: breathing non-labored at rest  Cardiovascular: regular rate and sinus rhythm  Gastrointestinal: soft, obese, non-tender, and non-distended with easily reducible large-defect umbilical hernia Musculoskeletal: UE and LE FROM, motor and sensation grossly intact, NT   Labs:  CBC Latest Ref Rng & Units 10/01/2016 09/30/2016 09/29/2016  WBC 3.8 - 10.6 K/uL 7.0 5.4 8.9  Hemoglobin 13.0 - 18.0 g/dL 13.3  12.9(L) 14.9  Hematocrit 40.0 - 52.0 % 39.4(L) 38.9(L) 44.4  Platelets 150 - 440 K/uL 189 176 244   CMP Latest Ref Rng & Units 10/01/2016 09/30/2016 09/29/2016  Glucose 65 - 99 mg/dL 190(H) 124(H) 88  BUN 6 - 20 mg/dL 15 22(H) 22(H)  Creatinine 0.61 - 1.24 mg/dL 1.03 1.15 1.32(H)  Sodium 135 - 145 mmol/L 135 137 138  Potassium 3.5 - 5.1 mmol/L 3.7 3.8 3.9  Chloride 101 - 111 mmol/L 100(L) 102 100(L)  CO2 22 - 32 mmol/L 28 24 28   Calcium 8.9 - 10.3 mg/dL 7.8(L) 8.5(L) 10.2  Total Protein 6.5 - 8.1 g/dL - 6.2(L) 7.4  Total Bilirubin 0.3 - 1.2 mg/dL - 1.2 1.0  Alkaline Phos 38 - 126 U/L - 39 48  AST 15 - 41 U/L - 17 24  ALT 17 - 63 U/L - 8(L) 10(L)   Imaging studies: No new pertinent imaging studies  Assessment/Plan: (ICD-10's: K42.0) 61 y.o. male with reduced chronic large-defect umbilical hernia, complicated by chronic anticoagulation for atrial fibrillation with RVR at time of presentation/admission without history of stroke/embolic event and by pertinent comorbidities including morbid obesity (BMI 44), DM, and HTN.              - diet advanced to mechanical soft diet             - medical management of comorbidities and anticoagulation as per medical team              - abdominal binder advised, may consider outpatient elective surgical repair  - Xarelto resumed, discharge planning  All of the above findings and recommendations were discussed with the patient, patient's  family, and the medical team, and all of patient's and family's questions were answered to his expressed satisfaction.  -- Marilynne Drivers Rosana Hoes, MD, [place]: [facility] - Partnering for exceptional care. Office: [phone]

## 2016-10-02 DIAGNOSIS — K460 Unspecified abdominal hernia with obstruction, without gangrene: Secondary | ICD-10-CM

## 2016-10-02 DIAGNOSIS — I4891 Unspecified atrial fibrillation: Secondary | ICD-10-CM

## 2016-10-02 DIAGNOSIS — K429 Umbilical hernia without obstruction or gangrene: Secondary | ICD-10-CM

## 2016-10-02 LAB — CBC
HCT: 39 % — ABNORMAL LOW (ref 40.0–52.0)
Hemoglobin: 13.2 g/dL (ref 13.0–18.0)
MCH: 28.2 pg (ref 26.0–34.0)
MCHC: 33.9 g/dL (ref 32.0–36.0)
MCV: 83.1 fL (ref 80.0–100.0)
PLATELETS: 181 10*3/uL (ref 150–440)
RBC: 4.69 MIL/uL (ref 4.40–5.90)
RDW: 14.7 % — AB (ref 11.5–14.5)
WBC: 5.6 10*3/uL (ref 3.8–10.6)

## 2016-10-02 LAB — GLUCOSE, CAPILLARY
GLUCOSE-CAPILLARY: 182 mg/dL — AB (ref 65–99)
Glucose-Capillary: 222 mg/dL — ABNORMAL HIGH (ref 65–99)

## 2016-10-02 NOTE — Progress Notes (Signed)
SURGICAL PROGRESS NOTE (cpt [phone])  Hospital Day(s): 3.   Post op day(s):  Marland Kitchen   Interval History: Patient seen and examined, no acute events or new complaints overnight. Patient proudly shows that he's wearing his abdominal binder and reports flatus, BM, and tolerating regular diet, denies any abdominal pain, N/V, fever/chills, palpitations, CP, or SOB.  Review of Systems:  Constitutional: denies fever, chills  HEENT: denies cough or congestion  Respiratory: denies any shortness of breath  Cardiovascular: denies chest pain or palpitations  Gastrointestinal: abdominal pain, N/V, and bowel function as per interval history Genitourinary: denies burning with urination or urinary frequency Musculoskeletal: denies pain, decreased motor or sensation Integumentary: denies any other rashes or skin discolorations Neurological: denies HA or vision/hearing changes   Vital signs in last 24 hours: [min-max] current  Temp:  [97.5 F (36.4 C)-98.2 F (36.8 C)] 97.8 F (36.6 C) (06/09 0727) Pulse Rate:  [75-106] 102 (06/09 0727) Resp:  [16-17] 17 (06/09 0727) BP: (116-150)/(65-80) 135/80 (06/09 0727) SpO2:  [97 %-99 %] 97 % (06/09 0727)     Height: 5' 10.5" (179.1 cm) Weight: 233 lb (105.7 kg) BMI (Calculated): 33.7   Intake/Output this shift:  No intake/output data recorded.   Intake/Output last 2 shifts:  @IOLAST2SHIFTS @   Physical Exam:  Constitutional: alert, cooperative and no distress  HENT: normocephalic without obvious abnormality  Eyes: PERRL, EOM's grossly intact and symmetric  Neuro: CN II - XII grossly intact and symmetric without deficit  Respiratory: breathing non-labored at rest  Cardiovascular: regular rate and sinus rhythm  Gastrointestinal: soft, obese, non-tender, and non-distended with easily reducible large-defect umbilical hernia, abdominal binder Musculoskeletal: UE and LE FROM, motor and sensation grossly intact, NT   Labs:  CBC Latest Ref Rng & Units 10/02/2016  10/01/2016 09/30/2016  WBC 3.8 - 10.6 K/uL 5.6 7.0 5.4  Hemoglobin 13.0 - 18.0 g/dL 13.2 13.3 12.9(L)  Hematocrit 40.0 - 52.0 % 39.0(L) 39.4(L) 38.9(L)  Platelets 150 - 440 K/uL 181 189 176   CMP Latest Ref Rng & Units 10/01/2016 09/30/2016 09/29/2016  Glucose 65 - 99 mg/dL 190(H) 124(H) 88  BUN 6 - 20 mg/dL 15 22(H) 22(H)  Creatinine 0.61 - 1.24 mg/dL 1.03 1.15 1.32(H)  Sodium 135 - 145 mmol/L 135 137 138  Potassium 3.5 - 5.1 mmol/L 3.7 3.8 3.9  Chloride 101 - 111 mmol/L 100(L) 102 100(L)  CO2 22 - 32 mmol/L 28 24 28   Calcium 8.9 - 10.3 mg/dL 7.8(L) 8.5(L) 10.2  Total Protein 6.5 - 8.1 g/dL - 6.2(L) 7.4  Total Bilirubin 0.3 - 1.2 mg/dL - 1.2 1.0  Alkaline Phos 38 - 126 U/L - 39 48  AST 15 - 41 U/L - 17 24  ALT 17 - 63 U/L - 8(L) 10(L)   Imaging studies: No new pertinent imaging studies  Assessment/Plan:(ICD-10's: K42.0) 61 y.o.malewith reduced chronic large-defect umbilical hernia, complicated by chronic anticoagulation for atrial fibrillation with RVR at time of presentation/admission without history of stroke/embolic event andby pertinent comorbidities including morbid obesity (BMI 44), DM, and HTN.  - patient tolerating mechanical soft diet  - strategies for hernia self-reduction again discussed and demonstrated - patient expresses willingness to wear abdominal binder and avoid lifting >15 lbs  - will plan for outpatient surgical follow-up and may consider outpatient elective surgical repair  - patient instructed to call surgery office or return to ER if unable to reduce >4 hours - medical management of comorbidities and anticoagulation as per medical team  - cardiology and medicine  recommendations reviewed and appreciated             - Xarelto resumed yesterday, will discharge home today  All of the above findings and recommendations were discussed with the patient, patient's family, and patient's RN, and all of patient's and family's questions  were answered to their expressed satisfaction.  -- Marilynne Drivers Rosana Hoes, MD, [place]: [facility] - Partnering for exceptional care. Office: [phone]

## 2016-10-02 NOTE — Progress Notes (Signed)
Pt discharged to home via wc.  Instructions  given to pt.  Questions answered.  No distress.  

## 2016-10-02 NOTE — Progress Notes (Signed)
[facility]  SUBJECTIVE: I feel better   Vitals:   10/01/16 1212 10/01/16 1941 10/02/16 0336 10/02/16 0727  BP: 116/65 (!) 150/71 (!) 141/77 135/80  Pulse: 82 (!) 106 95 (!) 102  Resp:  16 16 17   Temp: 97.5 F (36.4 C) 98.2 F (36.8 C) 97.8 F (36.6 C) 97.8 F (36.6 C)  TempSrc: Oral Oral Oral Oral  SpO2:  99% 99% 97%  Weight:      Height:         Intake/Output Summary (Last 24 hours) at 10/02/16 1005 Last data filed at 10/02/16 0900  Gross per 24 hour  Intake              720 ml  Output                0 ml  Net              720 ml      PHYSICAL EXAM  General: Well developed, well nourished, in no acute distress HEENT:  Normocephalic and atramatic Neck:  No JVD.  Lungs: Clear bilaterally to auscultation and percussion. Heart: HRRR . Normal S1 and S2 without gallops or murmurs.  Abdomen: Bowel sounds are positive, abdomen soft and non-tender  Msk:  Back normal, normal gait. Normal strength and tone for age. Extremities: No clubbing, cyanosis or edema.   Neuro: Alert and oriented X 3. Psych:  Good affect, responds appropriately   LABS: Basic Metabolic Panel:  Recent Labs  09/30/16 0355 10/01/16 0108  NA 137 135  K 3.8 3.7  CL 102 100*  CO2 24 28  GLUCOSE 124* 190*  BUN 22* 15  CREATININE 1.15 1.03  CALCIUM 8.5* 7.8*  MG 1.5* 1.6*   Liver Function Tests:  Recent Labs  09/29/16 1629 09/30/16 0355  AST 24 17  ALT 10* 8*  ALKPHOS 48 39  BILITOT 1.0 1.2  PROT 7.4 6.2*  ALBUMIN 4.4 3.4*    Recent Labs  09/29/16 1629  LIPASE 21   CBC:  Recent Labs  10/01/16 0108 10/02/16 0421  WBC 7.0 5.6  HGB 13.3 13.2  HCT 39.4* 39.0*  MCV 81.3 83.1  PLT 189 181   Cardiac Enzymes: No results for input(s): CKTOTAL, CKMB, CKMBINDEX, TROPONINI in the last 72 hours. BNP: Invalid input(s): POCBNP D-Dimer: No results for input(s): DDIMER in the last 72 hours. Hemoglobin A1C: No results for input(s): HGBA1C in the last 72 hours. Fasting Lipid  Panel: No results for input(s): CHOL, HDL, LDLCALC, TRIG, CHOLHDL, LDLDIRECT in the last 72 hours. Thyroid Function Tests: No results for input(s): TSH, T4TOTAL, T3FREE, THYROIDAB in the last 72 hours.  Invalid input(s): FREET3 Anemia Panel: No results for input(s): VITAMINB12, FOLATE, FERRITIN, TIBC, IRON, RETICCTPCT in the last 72 hours.  No results found.   Echo   TELEMETRY: Atrial fibrillation:  ASSESSMENT AND PLAN:  Active Problems:   Incarcerated hernia    1. Atrial fibrillation with controlled ventricular rate, back on oral medications including Xarelto for stroke prevention 2. Reduced incarcerated umbilical hernia, no immediate plans for surgery  Recommendations  1. Continue Xarelto for stroke prevention 2. Continue metoprolol tartrate for rate control 3. No further cardiac diagnostics at this time  Signed off for now, please call if any questions   Isaias Cowman, MD, PhD, [facility] 10/02/2016 10:05 AM

## 2016-10-02 NOTE — Progress Notes (Signed)
[facility]                                                                                                                                                                                  Patient Demographics   Reginald Phillips, is a 61 y.o. male, DOB - 10-Aug-1955, HWK:[id number]  Admit date - 09/29/2016   Admitting Physician Jules Husbands, MD  Outpatient Primary MD for the patient is [name], [name], MD   LOS - 3  Subjective:  Denies any abdominal pain, tolerating soft diet however her heart rate is still in 100 range. No shortness of breath, has abdominal binder on.  Review of Systems:   CONSTITUTIONAL: No documented fever. No fatigue, weakness. No weight gain, no weight loss.  EYES: No blurry or double vision.  ENT: No tinnitus. No postnasal drip. No redness of the oropharynx.  RESPIRATORY: No cough, no wheeze, no hemoptysis. No dyspnea.  CARDIOVASCULAR: No chest pain. No orthopnea. No palpitations. No syncope.  GASTROINTESTINAL: No nausea, no vomiting or diarrhea. Improved abdominal pain. No melena or hematochezia.  GENITOURINARY: No dysuria or hematuria.  ENDOCRINE: No polyuria or nocturia. No heat or cold intolerance.  HEMATOLOGY: No anemia. No bruising. No bleeding.  INTEGUMENTARY: No rashes. No lesions.  MUSCULOSKELETAL: No arthritis. No swelling. No gout.  NEUROLOGIC: No numbness, tingling, or ataxia. No seizure-type activity.  PSYCHIATRIC: No anxiety. No insomnia. No ADD.    Vitals:   Vitals:   10/01/16 1212 10/01/16 1941 10/02/16 0336 10/02/16 0727  BP: 116/65 (!) 150/71 (!) 141/77 135/80  Pulse: 82 (!) 106 95 (!) 102  Resp:  16 16 17   Temp: 97.5 F (36.4 C) 98.2 F (36.8 C) 97.8 F (36.6 C) 97.8 F (36.6 C)  TempSrc: Oral Oral Oral Oral  SpO2:  99% 99% 97%  Weight:      Height:        Wt Readings from Last 3 Encounters:  09/30/16 105.7 kg (233 lb)     Intake/Output Summary (Last 24 hours) at 10/02/16 1111 Last  data filed at 10/02/16 0900  Gross per 24 hour  Intake              720 ml  Output                0 ml  Net              720 ml    Physical Exam:   GENERAL: Pleasant-appearing in no apparent distress.  HEAD, EYES, EARS, NOSE AND THROAT: Atraumatic, normocephalic. Extraocular muscles are intact. Pupils equal and reactive to light. Sclerae anicteric. No conjunctival injection. No oro-pharyngeal  erythema.  NECK: Supple. There is no jugular venous distention. No bruits, no lymphadenopathy, no thyromegaly.  HEART: Irregularly iregular. No murmurs, no rubs, no clicks.  LUNGS: faint rales at bases,  ABDOMEN: Soft, improved bs,abdominal binder on. EXTREMITIES: No evidence of any cyanosis, clubbing, or peripheral edema.  +2 pedal and radial pulses bilaterally.  NEUROLOGIC: The patient is alert, awake, and oriented x3 with no focal motor or sensory deficits appreciated bilaterally.  SKIN: Moist and warm with no rashes appreciated.  Psych: Not anxious, depressed LN: No inguinal LN enlargement    Antibiotics   Anti-infectives    Start     Dose/Rate Route Frequency Ordered Stop   09/29/16 1915  piperacillin-tazobactam (ZOSYN) IVPB 3.375 g     3.375 g 100 mL/hr over 30 Minutes Intravenous  Once 09/29/16 1906 09/29/16 2022      Medications   Scheduled Meds: . digoxin  0.125 mg Intravenous Daily  . insulin aspart  0-20 Units Subcutaneous TID WC  . insulin aspart  0-5 Units Subcutaneous QHS  . insulin aspart  6 Units Subcutaneous TID WC  . metoprolol tartrate  25 mg Oral BID  . rivaroxaban  20 mg Oral Daily   Continuous Infusions: . dextrose 5 % and 0.45 % NaCl with KCl 20 mEq/L 75 mL/hr at 10/02/16 0205   PRN Meds:.hydrALAZINE, morphine injection, ondansetron **OR** ondansetron (ZOFRAN) IV   Data Review:   Micro Results No results found for this or any previous visit (from the past 240 hour(s)).  Radiology Reports Dg Abdomen 1 View  Result Date: 09/29/2016 CLINICAL DATA:  NG  tube placement EXAM: ABDOMEN - 1 VIEW COMPARISON:  09/29/2016 FINDINGS: Lateral view of the abdomen demonstrates tip of the esophageal tube to project over proximal stomach. There are dilated loops of small bowel concerning foreign obstruction. There is contrast in the bladder. IMPRESSION: 1. Esophageal tube tip overlies the proximal stomach 2. Dilated loops of small bowel consistent with bowel obstruction Electronically Signed   By: Donavan Foil M.D.   On: 09/29/2016 21:26   Ct Abdomen Pelvis W Contrast  Result Date: 09/29/2016 CLINICAL DATA:  Pt reports sent here by [facility] for evaluation of bowel obstruction. Pt reports generalized abdominal pain, nausea and vomiting in the past two days. Pt reports last BM this morning but was very small. EXAM: CT ABDOMEN AND PELVIS WITH CONTRAST TECHNIQUE: Multidetector CT imaging of the abdomen and pelvis was performed using the standard protocol following bolus administration of intravenous contrast. CONTRAST:  169mL ISOVUE-300 IOPAMIDOL (ISOVUE-300) INJECTION 61% COMPARISON:  None. FINDINGS: Lower chest: No acute abnormality. Hepatobiliary: No focal liver abnormality is seen. No gallstones, gallbladder wall thickening, or biliary dilatation. Pancreas: Unremarkable. No pancreatic ductal dilatation or surrounding inflammatory changes. Spleen: Normal in size without focal abnormality. Adrenals/Urinary Tract: Normal adrenal glands. Small parapelvic cysts left greater than right. No hydronephrosis or focal renal lesion. Urinary bladder incompletely distended. Stomach/Bowel: Small hiatal hernia. The stomach is nondilated. The duodenum is decompressed. However, there are multiple dilated loops of proximal and mid small bowel. There is small bowel involvement into a large paraumbilical hernia, which represents transition point to decompressed distal ileum. No pneumatosis. No portal venous gas. The colon is nondilated, unremarkable. Vascular/Lymphatic: Mild aortoiliac calcified  plaque without aneurysm or stenosis. No adenopathy localized. Reproductive: Prostate is unremarkable. Other: No ascites.  No free air. Musculoskeletal: Minimal spurring in the lower thoracic spine. No fracture or worrisome bone lesion. IMPRESSION: 1. Moderately large paraumbilical hernia with small bowel involvement,  causing distal small bowel obstruction. Electronically Signed   By: Lucrezia Europe M.D.   On: 09/29/2016 18:46   Dg Abd 2 Views  Result Date: 09/30/2016 CLINICAL DATA:  Small-bowel obstruction. EXAM: ABDOMEN - 2 VIEW COMPARISON:  09/29/2016.  CT 09/29/2016. FINDINGS: NG tube tip is in the proximal portion stomach. Advancement approximately 8 cm should be considered. Improvement of small bowel distention. Colonic gas pattern is normal. No free air. Contrast is in the bladder from recent CT. No acute bony abnormality identified. IMPRESSION: 1. NG tube tip noted in the proximal stomach. Advancement of approximately 8 cm should be considered. 2. Interim improvement of small bowel distention. Colonic gas pattern is normal. No free air . Electronically Signed   By: Marcello Moores  Register   On: 09/30/2016 08:13   Dg Abd Portable 1 View  Result Date: 09/29/2016 CLINICAL DATA:  Gastric tube placement EXAM: PORTABLE ABDOMEN - 1 VIEW COMPARISON:  Same day CT abdomen and pelvis FINDINGS: The end and side port of a gastric tube are seen below the left hemidiaphragm in the expected location of the proximal stomach. Dilated small bowel loops are seen and upper portion of large bowel. There is no pneumoperitoneum. There is cardiomegaly with atelectasis at the lung bases. No acute nor suspicious osseous abnormalities. IMPRESSION: Gastric tube in the expected location of the proximal stomach. Dilated small bowel loops are noted consistent with small bowel obstruction. Electronically Signed   By: Ashley Royalty M.D.   On: 09/29/2016 20:36     CBC  Recent Labs Lab 09/29/16 1629 09/30/16 0355 10/01/16 0108 10/02/16 0421   WBC 8.9 5.4 7.0 5.6  HGB 14.9 12.9* 13.3 13.2  HCT 44.4 38.9* 39.4* 39.0*  PLT 244 176 189 181  MCV 82.2 82.1 81.3 83.1  MCH 27.6 27.3 27.5 28.2  MCHC 33.6 33.3 33.9 33.9  RDW 14.8* 14.8* 14.4 14.7*    Chemistries   Recent Labs Lab 09/29/16 1629 09/30/16 0355 10/01/16 0108  NA 138 137 135  K 3.9 3.8 3.7  CL 100* 102 100*  CO2 28 24 28   GLUCOSE 88 124* 190*  BUN 22* 22* 15  CREATININE 1.32* 1.15 1.03  CALCIUM 10.2 8.5* 7.8*  MG  --  1.5* 1.6*  AST 24 17  --   ALT 10* 8*  --   ALKPHOS 48 39  --   BILITOT 1.0 1.2  --    ------------------------------------------------------------------------------------------------------------------ estimated creatinine clearance is 91.3 mL/min (by C-G formula based on SCr of 1.03 mg/dL). ------------------------------------------------------------------------------------------------------------------ No results for input(s): HGBA1C in the last 72 hours. ------------------------------------------------------------------------------------------------------------------ No results for input(s): CHOL, HDL, LDLCALC, TRIG, CHOLHDL, LDLDIRECT in the last 72 hours. ------------------------------------------------------------------------------------------------------------------ No results for input(s): TSH, T4TOTAL, T3FREE, THYROIDAB in the last 72 hours.  Invalid input(s): FREET3 ------------------------------------------------------------------------------------------------------------------ No results for input(s): VITAMINB12, FOLATE, FERRITIN, TIBC, IRON, RETICCTPCT in the last 72 hours.  Coagulation profile  Recent Labs Lab 09/30/16 0355  INR 1.32    No results for input(s): DDIMER in the last 72 hours.  Cardiac Enzymes No results for input(s): CKMB, TROPONINI, MYOGLOBIN in the last 168 hours.  Invalid input(s):  CK ------------------------------------------------------------------------------------------------------------------ Invalid input(s): [place]  Patient is a 61 year old admitted with incarcerated hernia and small bowel obstruction #1 incarcerated umbilical hernia: Admitted to surgery service, ngt out, no plan for surgery,Tolerating a soft diet.  2. A. fib with RVR '; resumed metoprolol, Xarelto, stopped  IV Cardizem drip.  On IV digoxin still.will change to PO.No  plans for surgery as patient is clinically improving and has multiple medical problems. Especially atrial fibrillation with RVR.  c3. Diabetes mellitus type 2:-Continue sliding scale insulin resume home meds on d/c  4. hypertention restart home meds on d/c          Code Status Orders        Start     Ordered   09/29/16 2043  Full code  Continuous     09/29/16 2046    Code Status History    Date Active Date Inactive Code Status Order ID Comments User Context   This patient has a current code status but no historical code status.            DVT Prophylaxis Heparin  Lab Results  Component Value Date   PLT 181 10/02/2016     Time Spent in minutes  32min Greater than 50% of time spent in care coordination and counseling patient regarding the condition and plan of care.   Epifanio Lesches M.D on 10/02/2016 at 11:11 AM  Between 7am to 6pm - Pager - [phone]  After 6pm go to [email or web address] - password [email or web address] [facility]   Office  [phone]

## 2016-10-02 NOTE — Plan of Care (Signed)
Problem: Safety: Goal: Ability to remain free from injury will improve Outcome: Progressing Fall precautions in place, non skid socks  Problem: Pain Managment: Goal: General experience of comfort will improve Outcome: Progressing Prn medications  Problem: Tissue Perfusion: Goal: Risk factors for ineffective tissue perfusion will decrease Outcome: Progressing Xarelto

## 2016-10-05 ENCOUNTER — Telehealth: Payer: Self-pay

## 2016-10-05 ENCOUNTER — Other Ambulatory Visit: Payer: Self-pay

## 2016-10-05 NOTE — Telephone Encounter (Signed)
Patient returned call at this time. Spoke with Reginald Phillips Post-op interview questions below.  1. How are you feeling? Feeling pretty good  2. Is your pain controlled? Yes not really having much pain  3. What are you doing for the pain? Not taking anything at this time  4. Are you having any Nausea or Vomiting? None  5. Are you having any Fever or Chills? None  6. Are you having any Constipation or Diarrhea? None   7. Is there any Swelling or Bruising you are concerned about? none  8. Do you have any questions or concerns at this time? none   Discussion: Reminded patient of appointment

## 2016-10-05 NOTE — Telephone Encounter (Signed)
Post-op call made to patient at this time. Left message for patient to call back with any questions or concerns.

## 2016-10-06 ENCOUNTER — Ambulatory Visit (INDEPENDENT_AMBULATORY_CARE_PROVIDER_SITE_OTHER): Payer: BLUE CROSS/BLUE SHIELD | Admitting: Surgery

## 2016-10-06 ENCOUNTER — Encounter: Payer: Self-pay | Admitting: Surgery

## 2016-10-06 ENCOUNTER — Telehealth: Payer: Self-pay

## 2016-10-06 ENCOUNTER — Other Ambulatory Visit: Payer: Self-pay

## 2016-10-06 VITALS — BP 149/82 | HR 77 | Temp 98.2°F | Ht 70.0 in | Wt 233.8 lb

## 2016-10-06 DIAGNOSIS — K420 Umbilical hernia with obstruction, without gangrene: Secondary | ICD-10-CM | POA: Diagnosis not present

## 2016-10-06 NOTE — Progress Notes (Signed)
Surgical Clinic Progress/Follow-up Note   HPI:  61 y.o. Male presents to clinic for follow-up evaluation of his large recently incarcerated, now again easily reducible, umbilical hernia, for which he says he has been avoiding lifting anything >15 lbs as instructed and proudly applying his abdominal binder every morning daily. He denies any abdominal pain, and patient reports his baseline alternating formed BM's, loose BM's, and brief intermittent constipation with consistent flatus without any worsened or productive cough, N/V, fever/chills, palpitations, CP, or SOB. However, as a typically active person who recently retired and would like to resume his normal activities that sometimes includes heavy lifting, he expresses he would like to have the hernia repaired and recognizes his recovery will include limited activity (no heavy lifting and wearing his abdominal binder) x 6 - 8 weeks after surgery.  Review of Systems:  Constitutional: denies any other weight loss, fever, chills, or sweats  Eyes: denies any other vision changes, history of eye injury  ENT: denies sore throat, hearing problems  Respiratory: denies shortness of breath, wheezing  Cardiovascular: denies chest pain, palpitations  Gastrointestinal: abdominal pain, N/V, and bowel function as per HPI Musculoskeletal: denies any other joint pains or cramps  Skin: Denies any other rashes or skin discolorations  Neurological: denies any other headache, dizziness, weakness  Psychiatric: denies any other depression, anxiety  All other review of systems: otherwise negative   Vital Signs:  BP (!) 149/82   Pulse 77   Temp 98.2 F (36.8 C) (Oral)   Ht 5\' 10"  (1.778 m)   Wt 233 lb 12.8 oz (106.1 kg)   BMI 33.55 kg/m    Physical Exam:  Constitutional:  -- Obese body habitus  -- Awake, alert, and oriented x3  Eyes:  -- Pupils equally round and reactive to light  -- No scleral icterus  Ear, nose, throat:  -- No jugular venous  distension  -- No nasal drainage, bleeding Pulmonary:  -- CTA B/L  -- Breathing non-labored at rest Cardiovascular:  -- S1, S2 present  -- No pericardial rubs  Gastrointestinal:  -- Soft, obese, nontender, nondistended, no guarding/rebound, large easily reducible umbilical hernia with associated skin changes -- No abdominal masses appreciated, pulsatile or otherwise  Musculoskeletal / Integumentary:  -- Wounds or skin discoloration: None appreciated except as above (umbilical hernia)  -- Extremities: B/L UE and LE FROM, hands and feet warm  Neurologic:  -- Motor function: intact and symmetric  -- Sensation: intact and symmetric   Assessment:  62 y.o. yo Male with a problem list including...  Patient Active Problem List   Diagnosis Date Noted  . Incarcerated hernia 09/29/2016  . Small bowel obstruction ([place])   . Paroxysmal A-fib ([place]) 11/06/2014  . Uncontrolled type 2 diabetes with neuropathy ([place]) 05/01/2014  . B12 deficiency 11/12/2013  . Hyperlipemia, mixed 10/24/2013  . Hypertension, essential, benign 10/24/2013    presents to clinic for follow-up evaluation of recent SBO attributed to recently incarcerated chronic umbilical hernia, doing well.  Plan:   - medical and cardiology risk stratification and optimization  - will discuss with patient's cardiologist stopping Xarelto for a-fib without history of embolic event (stroke, GI, or extremity)  - patient instructed to continue avoiding heavy lifting >15 lbs or strenuous activity and applying abdominal binder in am daily  - upon medical and cardiology evaluation and optimization, will return to office for consent and surgery scheduling  All of the above recommendations were discussed with the patient and patient's family, and all of patient's  and family's questions were answered to their expressed satisfaction.  -- Marilynne Drivers Rosana Hoes, MD, [place]: [facility] - Partnering for  exceptional care. Office: [phone]

## 2016-10-06 NOTE — Telephone Encounter (Signed)
Patient in need of Cardiology Clearance for Paroxysmal A-Fib, Anti-coagulant Clearance for Xeralto to be held for surgery, and for Medical Clearance to optimize patient in regards to Diabetes.  Cardiology Clearance and Anti-coagulant Clearance has been sent to Dr. Serafina Royals at this time. Patient has an upcoming appointment at their office on 10/18/16.  Medical Clearance has been sent to Dr. Thereasa Distance and has been seen by PCP last on 09/29/16.

## 2016-10-06 NOTE — Patient Instructions (Signed)
We will contact your PCP (Dr. Ellison Hughs) and your Cardiologist (Dr. Nehemiah Massed) in order to minimize your risks for this surgery.  As soon as we have spoken and have received notification from both Physicians, we will see you back in the office to discuss the risks and benefits of your surgery and place you on the OR schedule to repair this.  Please review all of the information below on both types of surgery to make a better decision for you and we will discuss this at your next appointment. Call our office with any questions that you may have prior to your next appointment.  Continue your Alen Blew until told otherwise.  Continue your lifting restrictions and wearing your abdominal binder until you are seen back in the office.   Laparoscopic Ventral Hernia Repair Laparoscopic ventral hernia repairis a procedure to fix a bulge of tissue that pushes through a weak area of muscle in the abdomen (ventral hernia). A ventral hernia may be at the belly button (umbilical), above the belly button (epigastric), or at the incision site from previous abdominal surgery (incisional hernia). You may have this procedure as emergency surgery if part of your intestine gets trapped inside the hernia and starts to lose its blood supply (strangulation). Laparoscopic surgery is done through small incisions using a thin surgical telescope with a light and camera on the end (laparoscope). During surgery, your surgeon will use images from the laparoscope to guide the procedure. A mesh screen will be placed in the hernia to close the opening and strengthen the abdominal wall. Tell a health care provider about:  Any allergies you have.  All medicines you are taking, including vitamins, herbs, eye drops, creams, and over-the-counter medicines.  Any problems you or family members have had with anesthetic medicines.  Any blood disorders you have.  Any surgeries you have had.  Any medical conditions you have.  Whether  you are pregnant or may be pregnant. What are the risks? Generally, this is a safe procedure. However, problems may occur, including:  Infection.  Bleeding.  Allergic reactions to medicines.  Damage to other structures or organs in the abdomen.  Trouble urinating or having a bowel movement after surgery.  Pneumonia.  Blood clots.  The hernia coming back after surgery.  Fluid buildup in the area of the hernia.  In some cases, your health care provider may need to switch from a laparoscopic procedure to a procedure that is done through a single, larger incision in the abdomen (open procedure). You may need an open procedure if:  You have a hernia that is difficult to repair.  Your organs are hard to see.  You have bleeding problems during the laparoscopic procedure.  What happens before the procedure? Staying hydrated Follow instructions from your health care provider about hydration, which may include:  Up to 2 hours before the procedure - you may continue to drink clear liquids, such as water, clear fruit juice, black coffee, and plain tea.  Eating and drinking restrictions Follow instructions from your health care provider about eating and drinking, which may include:  8 hours before the procedure - stop eating heavy meals or foods such as meat, fried foods, or fatty foods.  6 hours before the procedure - stop eating light meals or foods, such as toast or cereal.  6 hours before the procedure - stop drinking milk or drinks that contain milk.  2 hours before the procedure - stop drinking clear liquids.  Medicines  Ask your health  care provider about: ? Changing or stopping your regular medicines. This is especially important if you are taking diabetes medicines or blood thinners. ? Taking medicines such as aspirin and ibuprofen. These medicines can thin your blood. Do not take these medicines before your procedure if your health care provider instructs you not  to.  You may be given antibiotic medicine to help prevent infection. General instructions  You may be asked to take a laxative or do an enema to empty your bowel before surgery (bowel prep).  Do not use any products that contain nicotine or tobacco, such as cigarettes and e-cigarettes. If you need help quitting, ask your health care provider.  You may need to have tests before the procedure, such as: ? Blood tests. ? Urine tests. ? Abdominal ultrasound. ? Chest X-ray. ? Electrocardiogram (ECG).  Plan to have someone take you home from the hospital or clinic.  If you will be going home right after the procedure, plan to have someone with you for 24 hours. What happens during the procedure?  To reduce your risk of infection: ? Your health care team will wash or sanitize their hands. ? Your skin will be washed with soap.  An IV tube will be inserted into one of your veins.  You will be given one or more of the following: ? A medicine to help you relax (sedative). ? A medicine to make you fall asleep (general anesthetic).  A small incision will be made in your abdomen. A hollow metal tube (trocar) will be placed through the incision.  A tube will be placed through the trocar to inflate your abdomen with air-like gas. This makes it easier for your surgeon to see inside your abdomen and do the repair.  The laparoscope will be inserted into your abdomen through the trocar. The laparoscope will send images to a monitor in the operating room.  Other trocars will be put through other small incisions in your abdomen. The surgical instruments needed for the procedure will be placed through these trocars.  The tissue or intestines that make up the hernia will be moved back into place.  The edges of the hernia may be stitched together.  A piece of mesh will be used to close the hernia. Stitches (sutures), clips, or staples will be used to keep the mesh in place.  A bandage (dressing)  or skin glue will be put over the incisions. The procedure may vary among health care providers and hospitals. What happens after the procedure?  Your blood pressure, heart rate, breathing rate, and blood oxygen level will be monitored until the medicines you were given have worn off.  You will continue to receive fluids and medicines through an IV tube. Your IV tube will be removed when you can drink clear fluids.  You will be given pain medicine as needed.  You will be encouraged to get up and walk around as soon as possible.  You may have to wear compression stockings. These stockings help to prevent blood clots and reduce swelling in your legs.  You will be shown how to do deep breathing exercises to help prevent a lung infection.  Do not drive for 24 hours if you were given a sedative. This information is not intended to replace advice given to you by your health care provider. Make sure you discuss any questions you have with your health care provider. Document Released: 03/29/2012 Document Revised: 11/28/2015 Document Reviewed: 11/28/2015 Elsevier Interactive Patient Education  2018 Elsevier  Inc.    Open Hernia Repair, Adult Open hernia repair is a surgical procedure to fix a hernia. A hernia occurs when an internal organ or tissue pushes out through a weak spot in the abdominal wall muscles. Hernias commonly occur in the groin and around the navel. Most hernias tend to get worse over time. Often, surgery is done to prevent the hernia from becoming bigger, uncomfortable, or an emergency. Emergency surgery may be needed if abdominal contents get stuck in the opening (incarcerated hernia) or the blood supply gets cut off (strangulated hernia). In an open repair, an incision is made in the abdomen to perform the surgery. Tell a health care provider about:  Any allergies you have.  All medicines you are taking, including vitamins, herbs, eye drops, creams, and over-the-counter  medicines.  Any problems you or family members have had with anesthetic medicines.  Any blood or bone disorders you have.  Any surgeries you have had.  Any medical conditions you have, including any recent cold or flu symptoms.  Whether you are pregnant or may be pregnant. What are the risks? Generally, this is a safe procedure. However, problems may occur, including:  Long-lasting (chronic) pain.  Bleeding.  Infection.  Damage to the testicle. This can cause shrinking or swelling.  Damage to the bladder, blood vessels, intestine, or nerves near the hernia.  Trouble passing urine.  Allergic reactions to medicines.  Return of the hernia.  What happens before the procedure? Staying hydrated Follow instructions from your health care provider about hydration, which may include:  Up to 2 hours before the procedure - you may continue to drink clear liquids, such as water, clear fruit juice, black coffee, and plain tea.  Eating and drinking restrictions Follow instructions from your health care provider about eating and drinking, which may include:  8 hours before the procedure - stop eating heavy meals or foods such as meat, fried foods, or fatty foods.  6 hours before the procedure - stop eating light meals or foods, such as toast or cereal.  6 hours before the procedure - stop drinking milk or drinks that contain milk.  2 hours before the procedure - stop drinking clear liquids.  Medicines  Ask your health care provider about: ? Changing or stopping your regular medicines. This is especially important if you are taking diabetes medicines or blood thinners. ? Taking medicines such as aspirin and ibuprofen. These medicines can thin your blood. Do not take these medicines before your procedure if your health care provider instructs you not to.  You may be given antibiotic medicine to help prevent infection. General instructions  You may have blood tests or imaging  studies.  Ask your health care provider how your surgical site will be marked or identified.  If you smoke, do not smoke for at least 2 weeks before your procedure or for as long as told by your health care provider.  Let your health care provider know if you develop a cold or any infection before your surgery.  Plan to have someone take you home from the hospital or clinic.  If you will be going home right after the procedure, plan to have someone with you for 24 hours. What happens during the procedure?  To reduce your risk of infection: ? Your health care team will wash or sanitize their hands. ? Your skin will be washed with soap. ? Hair may be removed from the surgical area.  An IV tube will be inserted  into one of your veins.  You will be given one or more of the following: ? A medicine to help you relax (sedative). ? A medicine to numb the area (local anesthetic). ? A medicine to make you fall asleep (general anesthetic).  Your surgeon will make an incision over the hernia.  The tissues of the hernia will be moved back into place.  The edges of the hernia may be stitched together.  The opening in the abdominal muscles will be closed with stitches (sutures). Or, your surgeon will place a mesh patch made of manmade (synthetic) material over the opening.  The incision will be closed.  A bandage (dressing) may be placed over the incision. The procedure may vary among health care providers and hospitals. What happens after the procedure?  Your blood pressure, heart rate, breathing rate, and blood oxygen level will be monitored until the medicines you were given have worn off.  You may be given medicine for pain.  Do not drive for 24 hours if you received a sedative. This information is not intended to replace advice given to you by your health care provider. Make sure you discuss any questions you have with your health care provider. Document Released: 10/06/2000  Document Revised: 10/31/2015 Document Reviewed: 09/24/2015 Elsevier Interactive Patient Education  [employer].

## 2016-10-07 DIAGNOSIS — K429 Umbilical hernia without obstruction or gangrene: Secondary | ICD-10-CM

## 2016-10-07 DIAGNOSIS — I4891 Unspecified atrial fibrillation: Secondary | ICD-10-CM

## 2016-10-07 NOTE — Discharge Summary (Signed)
Physician Discharge Summary  Patient ID: Reginald Phillips MRN: [id number] DOB/AGE: 62-Dec-1957 62 y.o.  Admit date: 09/29/2016 Discharge date: 10/02/2016  Admission Diagnoses:  Discharge Diagnoses:  Active Problems:   Incarcerated hernia   Discharged Condition: good  Hospital Course: 61 year old obese diabetic Male with a chronic umbilical hernia x 10 years for which he was told by his PMD not to seek attention unless it bothered him and to instead avoid heavy lifting >10 lbs presented to [facility] ED with abdominal pain, distention, and N/V after he lifted three 50 lb bags of chicken feed 3.5 days prior to presentation, causing herniation of his umbilical hernia that did not subsequently reduce spontaneously as it always had previously. Patient states he was unaware he could self-reduce his hernia or that it could cause problems if not reduced. CT demonstrated SBO attributable to the incarcerated umbilical hernia, for which surgery was consulted and reduced the hernia bedside. Patient was also anticoagulated on Xarelto and with RVR secondary to atrial fibrillation upon presentation. Patient was admitted to the surgical service with medical and cardiology consultation, started on a heparin infusion, and patient began passing flatus and BM following reduction of his umbilical hernia. Patient's HR was controlled, abdominal binder was applied, SBO resolved, and patient was safely able to be discharged home with appropriate follow-up.  Consults: cardiology and hospitalist internal medicine  Significant Diagnostic Studies: radiology: CT scan: Moderately large paraumbilical hernia with small bowel involvement, causing distal small bowel obstruction  Treatments: IV hydration, anticoagulation: heparin and bedside reduction of incarcerated umbilical hernia  Discharge Exam: Blood pressure (!) 142/85, pulse 71, temperature 97.9 F (36.6 C), temperature source Oral, resp. rate 17, height 5' 10.5" (1.791 m), weight  233 lb (105.7 kg), SpO2 97 %. General appearance: alert, cooperative and no distress Cardio: irregularly irregular rhythm, no click and no rub GI: soft, non-tender; bowel sounds normal; easily reducible umbilical hernia with overlying skin changes  Disposition: 01-Home or Self Care   Allergies as of 10/02/2016   No Known Allergies     Medication List    TAKE these medications   co-enzyme Q-10 50 MG capsule Take 50 mg by mouth daily.   liraglutide 18 MG/3ML Sopn Inject 1.8 mg into the skin daily.   losartan 50 MG tablet Commonly known as:  COZAAR Take 25 mg by mouth daily.   metFORMIN 1000 MG tablet Commonly known as:  GLUCOPHAGE Take 1,000 mg by mouth 2 (two) times daily with a meal.   metoprolol tartrate 25 MG tablet Commonly known as:  LOPRESSOR Take 25 mg by mouth 2 (two) times daily.   multivitamin Tabs tablet Take 1 tablet by mouth daily.   rivaroxaban 20 MG Tabs tablet Commonly known as:  XARELTO Take 20 mg by mouth every morning.   rosuvastatin 5 MG tablet Commonly known as:  CRESTOR Take 5 mg by mouth at bedtime. On Monday, Wednesday, and Friday      Follow-up Information    Vickie Epley, MD. Go on 10/06/2016.   Specialty:  General Surgery Why:  Please arrive at office (in [place] of hospital) at 9:30 am for 9:45 am appointment. Contact information: [place] [address] [phone]           Signed: Vickie Epley 10/07/2016, 8:29 AM

## 2016-10-08 NOTE — Telephone Encounter (Signed)
Called Dr.Kowalski's office to check status of clearances and was told patient has an appointment on 10/18/16.     Checked also with Dr.[name] and Pamala Hurry sent a message to his nurse to call me.

## 2016-10-11 NOTE — Telephone Encounter (Signed)
Medical Clearance received from DR.Feldpaush at this time, however cannot provide clearance due to patient needs clearance from Cardiology.

## 2016-10-18 DIAGNOSIS — I4819 Other persistent atrial fibrillation: Secondary | ICD-10-CM | POA: Insufficient documentation

## 2016-10-19 ENCOUNTER — Telehealth: Payer: Self-pay

## 2016-10-19 NOTE — Telephone Encounter (Signed)
Cardiac Clearance and Anti-coagulant clearance obtained at this time to proceed with surgery. Please see office note [facility] 10/18/16 Care everywhere. Patient will stop Xeralto 3 days prior to surgery.

## 2016-10-19 NOTE — Telephone Encounter (Signed)
Medical Clearance faxed to Dr.[name] at this time.

## 2016-10-21 ENCOUNTER — Telehealth: Payer: Self-pay

## 2016-10-21 NOTE — Telephone Encounter (Signed)
Medical Clearance received from Dr.[name] at this time and will be scanned under Media.

## 2016-10-22 ENCOUNTER — Other Ambulatory Visit: Payer: Self-pay

## 2016-10-25 ENCOUNTER — Telehealth: Payer: Self-pay | Admitting: Surgery

## 2016-10-25 NOTE — Telephone Encounter (Signed)
Pt advised of pre op date/time and sx date. Sx: 11/04/16 With Dr. Marcy Siren umbilical hernia repair.  Pre op: 10/28/16 @ 11:00am--office.   Patient made aware to call [phone], between 1-3:00pm the day before surgery, to find out what time to arrive.

## 2016-10-28 ENCOUNTER — Encounter
Admission: RE | Admit: 2016-10-28 | Discharge: 2016-10-28 | Disposition: A | Discharge status: Home / Self Care | Payer: BLUE CROSS/BLUE SHIELD | Source: Ambulatory Visit | Attending: Surgery | Admitting: Surgery

## 2016-10-28 ENCOUNTER — Ambulatory Visit
Admission: RE | Admit: 2016-10-28 | Discharge: 2016-10-28 | Disposition: A | Discharge status: Home / Self Care | Payer: BLUE CROSS/BLUE SHIELD | Source: Ambulatory Visit | Attending: Surgery | Admitting: Surgery

## 2016-10-28 DIAGNOSIS — Z01818 Encounter for other preprocedural examination: Secondary | ICD-10-CM | POA: Insufficient documentation

## 2016-10-28 DIAGNOSIS — R918 Other nonspecific abnormal finding of lung field: Secondary | ICD-10-CM | POA: Diagnosis not present

## 2016-10-28 DIAGNOSIS — K439 Ventral hernia without obstruction or gangrene: Secondary | ICD-10-CM

## 2016-10-28 DIAGNOSIS — Z01812 Encounter for preprocedural laboratory examination: Secondary | ICD-10-CM | POA: Insufficient documentation

## 2016-10-28 DIAGNOSIS — Z01811 Encounter for preprocedural respiratory examination: Secondary | ICD-10-CM

## 2016-10-28 HISTORY — DX: Chronic kidney disease, unspecified: N18.9

## 2016-10-28 HISTORY — DX: Polyneuropathy, unspecified: G62.9

## 2016-10-28 HISTORY — DX: Cardiac arrhythmia, unspecified: I49.9

## 2016-10-28 LAB — CBC WITH DIFFERENTIAL/PLATELET
Basophils Absolute: 0 K/uL (ref 0–0.1)
Basophils Relative: 0 %
Eosinophils Absolute: 0.2 K/uL (ref 0–0.7)
Eosinophils Relative: 2 %
HCT: 42.9 % (ref 40.0–52.0)
Hemoglobin: 14.4 g/dL (ref 13.0–18.0)
Lymphocytes Relative: 23 %
Lymphs Abs: 2 K/uL (ref 1.0–3.6)
MCH: 27.7 pg (ref 26.0–34.0)
MCHC: 33.6 g/dL (ref 32.0–36.0)
MCV: 82.3 fL (ref 80.0–100.0)
Monocytes Absolute: 0.6 K/uL (ref 0.2–1.0)
Monocytes Relative: 7 %
Neutro Abs: 5.8 K/uL (ref 1.4–6.5)
Neutrophils Relative %: 68 %
Platelets: 183 K/uL (ref 150–440)
RBC: 5.21 MIL/uL (ref 4.40–5.90)
RDW: 15.2 % — ABNORMAL HIGH (ref 11.5–14.5)
WBC: 8.6 K/uL (ref 3.8–10.6)

## 2016-10-28 LAB — COMPREHENSIVE METABOLIC PANEL
ALBUMIN: 4.1 g/dL (ref 3.5–5.0)
ALT: 11 U/L — ABNORMAL LOW (ref 17–63)
AST: 23 U/L (ref 15–41)
Alkaline Phosphatase: 47 U/L (ref 38–126)
Anion gap: 7 (ref 5–15)
BUN: 17 mg/dL (ref 6–20)
CHLORIDE: 102 mmol/L (ref 101–111)
CO2: 28 mmol/L (ref 22–32)
Calcium: 9.5 mg/dL (ref 8.9–10.3)
Creatinine, Ser: 1.21 mg/dL (ref 0.61–1.24)
GFR calc Af Amer: >60 mL/min (ref >60)
GFR calc non Af Amer: >60 mL/min (ref >60)
GLUCOSE: 143 mg/dL — AB (ref 65–99)
POTASSIUM: 4.4 mmol/L (ref 3.5–5.1)
Sodium: 137 mmol/L (ref 135–145)
Total Bilirubin: 0.6 mg/dL (ref 0.3–1.2)
Total Protein: 6.9 g/dL (ref 6.5–8.1)

## 2016-10-28 NOTE — Patient Instructions (Signed)
  Your procedure is scheduled on: 11/04/16 Report to Day Surgery. [facility] SECOND FLOOR To find out your arrival time please call [phone] between 1PM - 3PM on 11/03/16 Remember: Instructions that are not followed completely may result in serious medical risk, up to and including death, or upon the discretion of your surgeon and anesthesiologist your surgery may need to be rescheduled.    _X___ 1. Do not eat food or drink liquids after midnight. No gum chewing or hard candies.     ____ 2. No Alcohol for 24 hours before or after surgery.   ____ 3. Do Not Smoke For 24 Hours Prior to Your Surgery.   ____ 4. Bring all medications with you on the day of surgery if instructed.    __X__ 5. Notify your doctor if there is any change in your medical condition     (cold, fever, infections).       Do not wear jewelry, make-up, hairpins, clips or nail polish.  Do not wear lotions, powders, or perfumes. You may wear deodorant.  Do not shave 48 hours prior to surgery. Men may shave face and neck.  Do not bring valuables to the hospital.    [facility] is not responsible for any belongings or valuables.               Contacts, dentures or bridgework may not be worn into surgery.  Leave your suitcase in the car. After surgery it may be brought to your room.  For patients admitted to the hospital, discharge time is determined by your                treatment team.   Patients discharged the day of surgery will not be allowed to drive home.   Please read over the following fact sheets that you were given:   Surgical Site Infection Prevention   __X__ Take these medicines the morning of surgery with A SIP OF WATER:    1. LOSARTAN  2. METOPROLOL  3.   4.  5.  6.  ____ Fleet Enema (as directed)   _X___ Use CHG Soap as directed  ____ Use inhalers on the day of surgery  _X__ Stop metformin 2 days prior to surgery    __X__ Take 1/2 of usual insulin dose the night before surgery and none  on the morning of surgery.   __X__ Stop Coumadin/Plavix/aspirin on       STOP XARELTO 3 DAYS BEFORE SURGERY AS INSTRUCTED BY DR Nehemiah Massed  ____ Stop Anti-inflammatories on    _X___ Stop supplements until after surgery.    ____ Bring C-Pap to the hospital.

## 2016-11-01 ENCOUNTER — Ambulatory Visit (INDEPENDENT_AMBULATORY_CARE_PROVIDER_SITE_OTHER): Payer: BLUE CROSS/BLUE SHIELD | Admitting: Surgery

## 2016-11-01 ENCOUNTER — Encounter: Payer: Self-pay | Admitting: Surgery

## 2016-11-01 VITALS — BP 133/85 | HR 72 | Temp 97.9°F | Ht 71.0 in | Wt 239.6 lb

## 2016-11-01 DIAGNOSIS — K429 Umbilical hernia without obstruction or gangrene: Secondary | ICD-10-CM

## 2016-11-01 NOTE — Pre-Procedure Instructions (Signed)
CXR CALLED AND FAXED TO [facility] AT DR DAVIS OFFICE

## 2016-11-01 NOTE — Patient Instructions (Signed)
You have requested for your Umbilical Hernia be repaired. We will arrange this to be done on 11/04/16 by Dr. Rosana Hoes at [facility].  Please see your (blue)pre-care sheet for information.  You will need to arrange to be off work for 1-2 weeks but will have to have a lifting restriction of no more than 15 lbs. For 6 weeks following your surgery.

## 2016-11-02 NOTE — Progress Notes (Signed)
Surgical Clinic History and Physical  Referring provider:  Sofie Hartigan, MD [address], [place] [address]  HISTORY OF PRESENT ILLNESS (HPI):  61 y.o. Male presents to clinic for follow-up evaluation of his large recently incarcerated, now again easily reducible, umbilical hernia, for which he says he has been avoiding lifting anything >15 lbs as instructed and proudly applying his abdominal binder every morning daily. He denies any abdominal pain, and patient reports his baseline alternating formed BM's, loose BM's, and brief intermittent constipation with consistent flatus without any worsened or productive cough, N/V, fever/chills, palpitations, CP, or SOB. However, as a typically active person who recently retired and would like to resume his normal activities that sometimes includes heavy lifting, he expresses he would like to have the hernia repaired and recognizes his recovery will include limited activity (no heavy lifting and wearing his abdominal binder) x 6 - 8 weeks after surgery.  PAST MEDICAL HISTORY (PMH):  Past Medical History:  Diagnosis Date  . B12 deficiency 11/12/2013  . Chronic kidney disease    50 % funtion from too much metformin  . Diabetes mellitus without complication ([place])   . Dysrhythmia   . Hernia cerebri ([place])   . Hyperlipemia, mixed 10/24/2013  . Hypertension   . Hypertension, essential, benign 10/24/2013  . Neuropathy   . Paroxysmal A-fib ([place]) 11/06/2014  . Small bowel obstruction ([place])   . 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 TONSILLECTOMY       MEDICATIONS:  Prior to Admission medications   Medication Sig Start Date End Date Taking? Authorizing Provider  co-enzyme Q-10 50 MG capsule Take 50 mg by mouth daily.   Yes [provider]  fenofibrate 160 MG tablet Take 160 mg by mouth daily.  08/31/16  Yes [provider]  gabapentin (NEURONTIN) 300 MG capsule Take 300 mg by mouth at bedtime as needed (pain).  05/01/14  Yes [provider]  insulin degludec (TRESIBA) 100 UNIT/ML SOPN FlexTouch Pen Inject 20 Units into the skin daily. Take in am. 10/21/16  Yes [provider]  liraglutide 18 MG/3ML SOPN Inject 1.8 mg into the skin daily.   Yes [provider]  losartan (COZAAR) 50 MG tablet Take 25 mg by mouth daily.   Yes [provider]  metFORMIN (GLUCOPHAGE) 1000 MG tablet Take 1,000 mg by mouth 2 (two) times daily with a meal.   Yes [provider]  metoprolol tartrate (LOPRESSOR) 25 MG tablet Take 25 mg by mouth 2 (two) times daily.   Yes [provider]  Multiple Vitamin (MULTI-VITAMINS) TABS Take 1 tablet by mouth daily.    Yes [provider]  NOVOLOG 100 UNIT/ML injection Inject 1-12 Units as directed 3 (three) times daily with meals. Sliding scale 09/24/16  Yes [provider]  rosuvastatin (CRESTOR) 5 MG tablet Take 5 mg by mouth every Monday, Wednesday, and Friday.    Yes [provider]  vitamin B-12 (CYANOCOBALAMIN) 1000 MCG tablet Take 1,000 mcg by mouth daily.    Yes [provider]  rivaroxaban (XARELTO) 20 MG TABS tablet Take 20 mg by mouth daily.     [provider]     ALLERGIES:  No Known Allergies   SOCIAL HISTORY:  Social History   Social History  . Marital status: Married    Spouse name: N/A  . Number of  children: N/A  . Years of education: N/A   Occupational History  . Not on file.   Social History Main Topics  . Smoking status: Never Smoker  . Smokeless tobacco: Never Used  . Alcohol use No  . Drug use: No  . Sexual activity: Not on file   Other Topics Concern  . Not on file   Social History Narrative  . No narrative on file    The patient currently resides (home / rehab facility / nursing home): Home  The patient normally is (ambulatory / bedbound):  Ambulatory   FAMILY HISTORY:  Family History  Problem Relation Age of Onset  . Heart disease Mother   . Heart disease Father   . Heart attack Father     Otherwise negative/non-contributory.  REVIEW OF SYSTEMS:  Constitutional: denies any other weight loss, fever, chills, or sweats  Eyes: denies any other vision changes, history of eye injury  ENT: denies sore throat, hearing problems  Respiratory: denies shortness of breath, wheezing  Cardiovascular: denies chest pain, palpitations  Gastrointestinal: abdominal pain, N/V, and bowel function as per HPI Musculoskeletal: denies any other joint pains or cramps  Skin: Denies any other rashes or skin discolorations  Neurological: denies any other headache, dizziness, weakness  Psychiatric: Denies any other depression, anxiety   All other review of systems were otherwise negative   VITAL SIGNS:  BP 133/85   Pulse 72 Comment: Regular  Temp 97.9 F (36.6 C) (Oral)   Ht 5\' 11"  (1.803 m)   Wt 239 lb 9.6 oz (108.7 kg)   BMI 33.42 kg/m    PHYSICAL EXAM:  Constitutional:  -- Obese body habitus  -- Awake, alert, and oriented x3  Eyes:  -- Pupils equally round and reactive to light  -- No scleral icterus  Ear, nose, throat:  -- No jugular venous distension -- No nasal drainage, bleeding Pulmonary:  -- No crackles  -- Equal breath sounds bilaterally -- Breathing non-labored at rest Cardiovascular:  -- S1, S2 present  -- No pericardial rubs  Gastrointestinal:  -- Abdomen soft, nontender, nondistended, no guarding/rebound  -- Easily reducible umbilical hernia with abdominal binder applied appropriately -- No abdominal masses appreciated, pulsatile or otherwise  Musculoskeletal and Integumentary:  -- Wounds or skin discoloration: None appreciated -- Extremities: B/L UE and LE FROM, hands and feet warm, no edema  Neurologic:  -- Motor function: Intact and symmetric -- Sensation: Intact and symmetric  Labs:  CBC:  Lab  Results  Component Value Date   WBC 8.6 10/28/2016   RBC 5.21 10/28/2016   BMP:  Lab Results  Component Value Date   GLUCOSE 143 (H) 10/28/2016   CO2 28 10/28/2016   BUN 17 10/28/2016   CREATININE 1.21 10/28/2016   CALCIUM 9.5 10/28/2016     Assessment/Plan:  61 y.o. yo Male with a problem list including...  Patient Active Problem List   Diagnosis Date Noted  . Incarcerated hernia 09/29/2016  . Small bowel obstruction ([place])   . Paroxysmal A-fib ([place]) 11/06/2014  . 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 B12 deficiency 11/12/2013  . Hyperlipemia, mixed 10/24/2013  . Hypertension, essential, benign 10/24/2013    presents to clinic for follow-up evaluation of recent SBO attributed to recently incarcerated chronic umbilical hernia, doing well.  Plan:              - medical and cardiology risk stratification and optimization appreciated             -  Xarelto for a-fib without history of embolic event (stroke, GI, or extremity) held  - all risks, benefits, and alternatives to above elective procedure(s) were discussed with the patient and his family, all of their questions were answered to their expressed satisfaction, patient expresses he wishes to proceed, and informed consent was accordingly obtained.             - patient instructed to continue avoiding heavy lifting >15 lbs or strenuous activity and applying abdominal binder in am daily             - plan for laparoscopic repair of large reducible umbilical hernia with mesh Thursday, 7/12  - instructed to call if any questions or concerns  All of the above recommendations were discussed with the patient and patient's family, and all of patient's and family's questions were answered to their expressed satisfaction.  -- Marilynne Drivers Rosana Hoes, MD, [place]: [facility] - Partnering for exceptional care. Office: [phone]

## 2016-11-03 MED ORDER — DEXTROSE 5 % IV SOLN
3.0000 g | INTRAVENOUS | Status: AC
  Administered 2016-11-04: 3 g via INTRAVENOUS
  Filled 2016-11-03: qty 3000

## 2016-11-04 ENCOUNTER — Ambulatory Visit
Admission: RE | Admit: 2016-11-04 | Discharge: 2016-11-04 | Disposition: A | Discharge status: Home / Self Care | Payer: BLUE CROSS/BLUE SHIELD | Source: Ambulatory Visit | Attending: Surgery | Admitting: Surgery

## 2016-11-04 ENCOUNTER — Ambulatory Visit: Payer: BLUE CROSS/BLUE SHIELD | Admitting: Anesthesiology

## 2016-11-04 ENCOUNTER — Encounter
Admission: RE | Disposition: A | Discharge status: Home / Self Care | Payer: Self-pay | Source: Ambulatory Visit | Attending: Surgery

## 2016-11-04 ENCOUNTER — Encounter: Payer: Self-pay | Admitting: *Deleted

## 2016-11-04 DIAGNOSIS — E782 Mixed hyperlipidemia: Secondary | ICD-10-CM | POA: Insufficient documentation

## 2016-11-04 DIAGNOSIS — E538 Deficiency of other specified B group vitamins: Secondary | ICD-10-CM | POA: Diagnosis not present

## 2016-11-04 DIAGNOSIS — E1140 Type 2 diabetes mellitus with diabetic neuropathy, unspecified: Secondary | ICD-10-CM | POA: Insufficient documentation

## 2016-11-04 DIAGNOSIS — I480 Paroxysmal atrial fibrillation: Secondary | ICD-10-CM | POA: Diagnosis not present

## 2016-11-04 DIAGNOSIS — E1122 Type 2 diabetes mellitus with diabetic chronic kidney disease: Secondary | ICD-10-CM | POA: Insufficient documentation

## 2016-11-04 DIAGNOSIS — N189 Chronic kidney disease, unspecified: Secondary | ICD-10-CM | POA: Insufficient documentation

## 2016-11-04 DIAGNOSIS — Z7901 Long term (current) use of anticoagulants: Secondary | ICD-10-CM | POA: Diagnosis not present

## 2016-11-04 DIAGNOSIS — I129 Hypertensive chronic kidney disease with stage 1 through stage 4 chronic kidney disease, or unspecified chronic kidney disease: Secondary | ICD-10-CM | POA: Insufficient documentation

## 2016-11-04 DIAGNOSIS — Z794 Long term (current) use of insulin: Secondary | ICD-10-CM | POA: Diagnosis not present

## 2016-11-04 DIAGNOSIS — K420 Umbilical hernia with obstruction, without gangrene: Secondary | ICD-10-CM | POA: Diagnosis not present

## 2016-11-04 DIAGNOSIS — Z79899 Other long term (current) drug therapy: Secondary | ICD-10-CM | POA: Diagnosis not present

## 2016-11-04 DIAGNOSIS — K429 Umbilical hernia without obstruction or gangrene: Secondary | ICD-10-CM | POA: Diagnosis not present

## 2016-11-04 HISTORY — PX: VENTRAL HERNIA REPAIR: SHX424

## 2016-11-04 LAB — GLUCOSE, CAPILLARY
GLUCOSE-CAPILLARY: 212 mg/dL — AB (ref 65–99)
Glucose-Capillary: 141 mg/dL — ABNORMAL HIGH (ref 65–99)

## 2016-11-04 SURGERY — REPAIR, HERNIA, VENTRAL, LAPAROSCOPIC
Anesthesia: General | Wound class: Clean

## 2016-11-04 MED ORDER — PROPOFOL 10 MG/ML IV BOLUS
INTRAVENOUS | Status: DC | PRN
  Administered 2016-11-04: 200 mg via INTRAVENOUS

## 2016-11-04 MED ORDER — OXYCODONE HCL 5 MG/5ML PO SOLN: 5.0000 mg | Freq: Once | ORAL | Status: DC | PRN

## 2016-11-04 MED ORDER — PHENYLEPHRINE HCL 10 MG/ML IJ SOLN
INTRAMUSCULAR | Status: AC
  Filled 2016-11-04: qty 1

## 2016-11-04 MED ORDER — ROCURONIUM BROMIDE 50 MG/5ML IV SOLN
INTRAVENOUS | Status: AC
  Filled 2016-11-04: qty 1

## 2016-11-04 MED ORDER — ROCURONIUM BROMIDE 100 MG/10ML IV SOLN
INTRAVENOUS | Status: DC | PRN
  Administered 2016-11-04: 50 mg via INTRAVENOUS
  Administered 2016-11-04: 20 mg via INTRAVENOUS
  Administered 2016-11-04: 5 mg via INTRAVENOUS

## 2016-11-04 MED ORDER — OXYCODONE-ACETAMINOPHEN 5-325 MG PO TABS: 1.0000 | ORAL_TABLET | Freq: Four times a day (QID) | ORAL | 0 refills | Status: DC | PRN

## 2016-11-04 MED ORDER — SUGAMMADEX SODIUM 500 MG/5ML IV SOLN
INTRAVENOUS | Status: AC
  Filled 2016-11-04: qty 5

## 2016-11-04 MED ORDER — KETOROLAC TROMETHAMINE 30 MG/ML IJ SOLN
INTRAMUSCULAR | Status: DC | PRN
  Administered 2016-11-04: 15 mg via INTRAVENOUS

## 2016-11-04 MED ORDER — SUGAMMADEX SODIUM 500 MG/5ML IV SOLN
INTRAVENOUS | Status: DC | PRN
  Administered 2016-11-04: 217.4 mg via INTRAVENOUS

## 2016-11-04 MED ORDER — ONDANSETRON HCL 4 MG/2ML IJ SOLN
INTRAMUSCULAR | Status: DC | PRN
  Administered 2016-11-04: 4 mg via INTRAVENOUS

## 2016-11-04 MED ORDER — FENTANYL CITRATE (PF) 100 MCG/2ML IJ SOLN
INTRAMUSCULAR | Status: DC | PRN
  Administered 2016-11-04 (×2): 50 ug via INTRAVENOUS

## 2016-11-04 MED ORDER — FAMOTIDINE 20 MG PO TABS
20.0000 mg | ORAL_TABLET | Freq: Once | ORAL | Status: AC
  Administered 2016-11-04: 20 mg via ORAL

## 2016-11-04 MED ORDER — PROMETHAZINE HCL 25 MG/ML IJ SOLN: 6.2500 mg | INTRAMUSCULAR | Status: DC | PRN

## 2016-11-04 MED ORDER — PROPOFOL 10 MG/ML IV BOLUS
INTRAVENOUS | Status: AC
  Filled 2016-11-04: qty 20

## 2016-11-04 MED ORDER — EPHEDRINE SULFATE 50 MG/ML IJ SOLN
INTRAMUSCULAR | Status: DC | PRN
  Administered 2016-11-04 (×3): 5 mg via INTRAVENOUS

## 2016-11-04 MED ORDER — FENTANYL CITRATE (PF) 100 MCG/2ML IJ SOLN
25.0000 ug | INTRAMUSCULAR | Status: DC | PRN
  Administered 2016-11-04: 50 ug via INTRAVENOUS

## 2016-11-04 MED ORDER — FENTANYL CITRATE (PF) 100 MCG/2ML IJ SOLN
INTRAMUSCULAR | Status: AC
  Filled 2016-11-04: qty 2

## 2016-11-04 MED ORDER — OXYCODONE HCL 5 MG PO TABS: 5.0000 mg | ORAL_TABLET | Freq: Once | ORAL | Status: DC | PRN

## 2016-11-04 MED ORDER — LIDOCAINE HCL (PF) 2 % IJ SOLN
INTRAMUSCULAR | Status: AC
  Filled 2016-11-04: qty 2

## 2016-11-04 MED ORDER — CHLORHEXIDINE GLUCONATE CLOTH 2 % EX PADS: 6.0000 | MEDICATED_PAD | Freq: Once | CUTANEOUS | Status: DC

## 2016-11-04 MED ORDER — MIDAZOLAM HCL 2 MG/2ML IJ SOLN
INTRAMUSCULAR | Status: DC | PRN
  Administered 2016-11-04: 2 mg via INTRAVENOUS

## 2016-11-04 MED ORDER — GLYCOPYRROLATE 0.2 MG/ML IJ SOLN
INTRAMUSCULAR | Status: DC | PRN
  Administered 2016-11-04 (×2): 0.1 mg via INTRAVENOUS

## 2016-11-04 MED ORDER — CHLORHEXIDINE GLUCONATE CLOTH 2 % EX PADS
6.0000 | MEDICATED_PAD | Freq: Once | CUTANEOUS | Status: AC
  Administered 2016-11-04: 6 via TOPICAL

## 2016-11-04 MED ORDER — BUPIVACAINE HCL (PF) 0.5 % IJ SOLN
INTRAMUSCULAR | Status: AC
  Filled 2016-11-04: qty 30

## 2016-11-04 MED ORDER — SODIUM CHLORIDE 0.9 % IV SOLN
INTRAVENOUS | Status: DC
  Administered 2016-11-04: 06:00:00 via INTRAVENOUS

## 2016-11-04 MED ORDER — ACETAMINOPHEN 10 MG/ML IV SOLN
INTRAVENOUS | Status: DC | PRN
  Administered 2016-11-04: 1000 mg via INTRAVENOUS

## 2016-11-04 MED ORDER — FENTANYL CITRATE (PF) 100 MCG/2ML IJ SOLN
INTRAMUSCULAR | Status: AC
  Administered 2016-11-04: 50 ug via INTRAVENOUS
  Filled 2016-11-04: qty 2

## 2016-11-04 MED ORDER — MIDAZOLAM HCL 2 MG/2ML IJ SOLN
INTRAMUSCULAR | Status: AC
  Filled 2016-11-04: qty 2

## 2016-11-04 MED ORDER — SUCCINYLCHOLINE CHLORIDE 20 MG/ML IJ SOLN
INTRAMUSCULAR | Status: AC
  Filled 2016-11-04: qty 1

## 2016-11-04 MED ORDER — LIDOCAINE HCL (PF) 1 % IJ SOLN
INTRAMUSCULAR | Status: AC
  Filled 2016-11-04: qty 30

## 2016-11-04 MED ORDER — ACETAMINOPHEN 10 MG/ML IV SOLN
INTRAVENOUS | Status: AC
  Filled 2016-11-04: qty 100

## 2016-11-04 MED ORDER — SUCCINYLCHOLINE CHLORIDE 20 MG/ML IJ SOLN
INTRAMUSCULAR | Status: DC | PRN
  Administered 2016-11-04: 120 mg via INTRAVENOUS

## 2016-11-04 MED ORDER — PHENYLEPHRINE HCL 10 MG/ML IJ SOLN
INTRAMUSCULAR | Status: DC | PRN
  Administered 2016-11-04 (×3): 100 ug via INTRAVENOUS
  Administered 2016-11-04 (×3): 150 ug via INTRAVENOUS
  Administered 2016-11-04 (×2): 50 ug via INTRAVENOUS
  Administered 2016-11-04 (×2): 100 ug via INTRAVENOUS
  Administered 2016-11-04 (×2): 150 ug via INTRAVENOUS
  Administered 2016-11-04 (×2): 100 ug via INTRAVENOUS
  Administered 2016-11-04: 150 ug via INTRAVENOUS

## 2016-11-04 MED ORDER — ONDANSETRON HCL 4 MG/2ML IJ SOLN
INTRAMUSCULAR | Status: AC
  Filled 2016-11-04: qty 2

## 2016-11-04 MED ORDER — FAMOTIDINE 20 MG PO TABS
ORAL_TABLET | ORAL | Status: AC
  Filled 2016-11-04: qty 1

## 2016-11-04 MED ORDER — LIDOCAINE HCL 1 % IJ SOLN
INTRAMUSCULAR | Status: DC | PRN
  Administered 2016-11-04: 15 mL

## 2016-11-04 MED ORDER — LIDOCAINE HCL (CARDIAC) 20 MG/ML IV SOLN
INTRAVENOUS | Status: DC | PRN
  Administered 2016-11-04: 100 mg via INTRAVENOUS

## 2016-11-04 MED ORDER — MEPERIDINE HCL 50 MG/ML IJ SOLN: 6.2500 mg | INTRAMUSCULAR | Status: DC | PRN

## 2016-11-04 SURGICAL SUPPLY — 39 items
BAG HAMPER (MISCELLANEOUS) ×4 IMPLANT
CHLORAPREP W/TINT 26ML (MISCELLANEOUS) ×4 IMPLANT
COVER LIGHT HANDLE STERIS (MISCELLANEOUS) ×8 IMPLANT
DECANTER SPIKE VIAL GLASS SM (MISCELLANEOUS) ×8 IMPLANT
DERMABOND ADVANCED (GAUZE/BANDAGES/DRESSINGS) ×2
DERMABOND ADVANCED .7 DNX12 (GAUZE/BANDAGES/DRESSINGS) ×2 IMPLANT
ELECT REM PT RETURN 9FT ADLT (ELECTROSURGICAL) ×4
ELECTRODE REM PT RTRN 9FT ADLT (ELECTROSURGICAL) ×2 IMPLANT
GLOVE BIOGEL PI IND STRL 7.5 (GLOVE) ×2 IMPLANT
GLOVE BIOGEL PI INDICATOR 7.5 (GLOVE) ×2
GLOVE ECLIPSE 7.0 STRL STRAW (GLOVE) ×4 IMPLANT
GOWN STRL REUS W/TWL LRG LVL3 (GOWN DISPOSABLE) ×12 IMPLANT
GRASPER SUT TROCAR 14GX15 (MISCELLANEOUS) ×4 IMPLANT
KIT RM TURNOVER STRD PROC AR (KITS) ×4 IMPLANT
LABEL OR SOLS (LABEL) ×4 IMPLANT
MESH VENTRALIGHT ST 6X8 (Mesh Specialty) ×2 IMPLANT
MESH VENTRLGHT ELLIPSE 8X6XMFL (Mesh Specialty) ×2 IMPLANT
NEEDLE HYPO 25X1 1.5 SAFETY (NEEDLE) ×4 IMPLANT
NEEDLE INSUFFLATION 14GA 120MM (NEEDLE) ×4 IMPLANT
NEEDLE SPNL 25GX3.5 QUINCKE BL (NEEDLE) ×4 IMPLANT
NS IRRIG 1000ML POUR BTL (IV SOLUTION) ×4 IMPLANT
PACK BASIN MINOR ARMC (MISCELLANEOUS) ×4 IMPLANT
PAD ARMBOARD 7.5X6 YLW CONV (MISCELLANEOUS) ×4 IMPLANT
PENCIL ELECTRO HAND CTR (MISCELLANEOUS) ×4 IMPLANT
SLEEVE ENDOPATH XCEL 5M (ENDOMECHANICALS) ×8 IMPLANT
SUT ETHIBOND CT1 BRD 2-0 30IN (SUTURE) ×16 IMPLANT
SUT ETHIBOND NAB MO 7 #0 18IN (SUTURE) IMPLANT
SUT MNCRL AB 4-0 PS2 18 (SUTURE) ×4 IMPLANT
SUT VIC AB 2-0 CT2 27 (SUTURE) IMPLANT
SUT VIC AB 3-0 54X BRD REEL (SUTURE) IMPLANT
SUT VIC AB 3-0 BRD 54 (SUTURE)
SUT VIC AB 3-0 SH 27 (SUTURE) ×2
SUT VIC AB 3-0 SH 27X BRD (SUTURE) ×2 IMPLANT
SUT VICRYL 0 AB UR-6 (SUTURE) ×4 IMPLANT
SYR 10ML LL (SYRINGE) ×4 IMPLANT
SYR BULB IRRIG 60ML STRL (SYRINGE) IMPLANT
TACKER 5MM HERNIA 3.5CML NAB (ENDOMECHANICALS) ×4 IMPLANT
TROCAR XCEL 12X100 BLDLESS (ENDOMECHANICALS) ×4 IMPLANT
TROCAR XCEL NON-BLD 5MMX100MML (ENDOMECHANICALS) ×4 IMPLANT

## 2016-11-04 NOTE — Anesthesia Post-op Follow-up Note (Cosign Needed)
Anesthesia QCDR form completed.        

## 2016-11-04 NOTE — Anesthesia Procedure Notes (Signed)
Procedure Name: Intubation Performed by: Demetrius Charity Pre-anesthesia Checklist: Patient identified, Patient being monitored, Timeout performed, Emergency Drugs available and Suction available Patient Re-evaluated:Patient Re-evaluated prior to induction Oxygen Delivery Method: Circle system utilized Preoxygenation: Pre-oxygenation with 100% oxygen Induction Type: IV induction Ventilation: Two handed mask ventilation required Laryngoscope Size: Mac and 4 Grade View: Grade I Tube type: Oral Tube size: 7.5 mm Number of attempts: 1 Airway Equipment and Method: Stylet Placement Confirmation: ETT inserted through vocal cords under direct vision,  positive ETCO2 and breath sounds checked- equal and bilateral Secured at: 24 cm Tube secured with: Tape Dental Injury: Teeth and Oropharynx as per pre-operative assessment

## 2016-11-04 NOTE — Transfer of Care (Signed)
Immediate Anesthesia Transfer of Care Note  Patient: Reginald Phillips  Procedure(s) Performed: Procedure(s): LAPAROSCOPIC VENTRAL HERNIA  Patient Location: PACU  Anesthesia Type:General  Level of Consciousness: awake, alert  and oriented  Airway & Oxygen Therapy: Patient Spontanous Breathing and Patient connected to face mask oxygen  Post-op Assessment: Report given to RN and Post -op Vital signs reviewed and stable  Post vital signs: Reviewed and stable  Last Vitals:  Vitals:   11/04/16 0605  BP: (!) 142/72  Pulse: (!) 58  Temp: (!) 36.4 C    Last Pain:  Vitals:   11/04/16 0605  TempSrc: Oral         Complications: No apparent anesthesia complications

## 2016-11-04 NOTE — H&P (View-Only) (Signed)
Surgical Clinic History and Physical  Referring provider:  Sofie Hartigan, MD [address] [place], [place] [address]  HISTORY OF PRESENT ILLNESS (HPI):  61 y.o. Male presents to clinic for follow-up evaluation of his large recently incarcerated, now again easily reducible, umbilical hernia, for which he says he has been avoiding lifting anything >15 lbs as instructed and proudly applying his abdominal binder every morning daily. He denies any abdominal pain, and patient reports his baseline alternating formed BM's, loose BM's, and brief intermittent constipation with consistent flatus without any worsened or productive cough, N/V, fever/chills, palpitations, CP, or SOB. However, as a typically active person who recently retired and would like to resume his normal activities that sometimes includes heavy lifting, he expresses he would like to have the hernia repaired and recognizes his recovery will include limited activity (no heavy lifting and wearing his abdominal binder) x 6 - 8 weeks after surgery.  PAST MEDICAL HISTORY (PMH):  Past Medical History:  Diagnosis Date  . B12 deficiency 11/12/2013  . Chronic kidney disease    50 % funtion from too much metformin  . Diabetes mellitus without complication ([place])   . Dysrhythmia   . Hernia cerebri ([place])   . Hyperlipemia, mixed 10/24/2013  . Hypertension   . Hypertension, essential, benign 10/24/2013  . Neuropathy   . Paroxysmal A-fib ([place]) 11/06/2014  . Small bowel obstruction ([place])   . 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 TONSILLECTOMY       MEDICATIONS:  Prior to Admission medications   Medication Sig Start Date End Date Taking? Authorizing Provider  co-enzyme Q-10 50 MG capsule Take 50 mg by mouth daily.   Yes [provider]  fenofibrate 160 MG tablet Take 160 mg by mouth daily.  08/31/16  Yes [provider]  gabapentin (NEURONTIN) 300 MG capsule Take 300 mg by mouth at bedtime as needed (pain).  05/01/14  Yes [provider]  insulin degludec (TRESIBA) 100 UNIT/ML SOPN FlexTouch Pen Inject 20 Units into the skin daily. Take in am. 10/21/16  Yes [provider]  liraglutide 18 MG/3ML SOPN Inject 1.8 mg into the skin daily.   Yes [provider]  losartan (COZAAR) 50 MG tablet Take 25 mg by mouth daily.   Yes [provider]  metFORMIN (GLUCOPHAGE) 1000 MG tablet Take 1,000 mg by mouth 2 (two) times daily with a meal.   Yes [provider]  metoprolol tartrate (LOPRESSOR) 25 MG tablet Take 25 mg by mouth 2 (two) times daily.   Yes [provider]  Multiple Vitamin (MULTI-VITAMINS) TABS Take 1 tablet by mouth daily.    Yes [provider]  NOVOLOG 100 UNIT/ML injection Inject 1-12 Units as directed 3 (three) times daily with meals. Sliding scale 09/24/16  Yes [provider]  rosuvastatin (CRESTOR) 5 MG tablet Take 5 mg by mouth every Monday, Wednesday, and Friday.    Yes [provider]  vitamin B-12 (CYANOCOBALAMIN) 1000 MCG tablet Take 1,000 mcg by mouth daily.    Yes [provider]  rivaroxaban (XARELTO) 20 MG TABS tablet Take 20 mg by mouth daily.     [provider]     ALLERGIES:  No Known Allergies   SOCIAL HISTORY:  Social History   Social History  . Marital status: Married    Spouse name: N/A  . Number of  children: N/A  . Years of education: N/A   Occupational History  . Not on file.   Social History Main Topics  . Smoking status: Never Smoker  . Smokeless tobacco: Never Used  . Alcohol use No  . Drug use: No  . Sexual activity: Not on file   Other Topics Concern  . Not on file   Social History Narrative  . No narrative on file    The patient currently resides (home / rehab facility / nursing home): Home  The patient normally is (ambulatory / bedbound):  Ambulatory   FAMILY HISTORY:  Family History  Problem Relation Age of Onset  . Heart disease Mother   . Heart disease Father   . Heart attack Father     Otherwise negative/non-contributory.  REVIEW OF SYSTEMS:  Constitutional: denies any other weight loss, fever, chills, or sweats  Eyes: denies any other vision changes, history of eye injury  ENT: denies sore throat, hearing problems  Respiratory: denies shortness of breath, wheezing  Cardiovascular: denies chest pain, palpitations  Gastrointestinal: abdominal pain, N/V, and bowel function as per HPI Musculoskeletal: denies any other joint pains or cramps  Skin: Denies any other rashes or skin discolorations  Neurological: denies any other headache, dizziness, weakness  Psychiatric: Denies any other depression, anxiety   All other review of systems were otherwise negative   VITAL SIGNS:  BP 133/85   Pulse 72 Comment: Regular  Temp 97.9 F (36.6 C) (Oral)   Ht 5\' 11"  (1.803 m)   Wt 239 lb 9.6 oz (108.7 kg)   BMI 33.42 kg/m    PHYSICAL EXAM:  Constitutional:  -- Obese body habitus  -- Awake, alert, and oriented x3  Eyes:  -- Pupils equally round and reactive to light  -- No scleral icterus  Ear, nose, throat:  -- No jugular venous distension -- No nasal drainage, bleeding Pulmonary:  -- No crackles  -- Equal breath sounds bilaterally -- Breathing non-labored at rest Cardiovascular:  -- S1, S2 present  -- No pericardial rubs  Gastrointestinal:  -- Abdomen soft, nontender, nondistended, no guarding/rebound  -- Easily reducible umbilical hernia with abdominal binder applied appropriately -- No abdominal masses appreciated, pulsatile or otherwise  Musculoskeletal and Integumentary:  -- Wounds or skin discoloration: None appreciated -- Extremities: B/L UE and LE FROM, hands and feet warm, no edema  Neurologic:  -- Motor function: Intact and symmetric -- Sensation: Intact and symmetric  Labs:  CBC:  Lab  Results  Component Value Date   WBC 8.6 10/28/2016   RBC 5.21 10/28/2016   BMP:  Lab Results  Component Value Date   GLUCOSE 143 (H) 10/28/2016   CO2 28 10/28/2016   BUN 17 10/28/2016   CREATININE 1.21 10/28/2016   CALCIUM 9.5 10/28/2016     Assessment/Plan:  61 y.o. yo Male with a problem list including...  Patient Active Problem List   Diagnosis Date Noted  . Incarcerated hernia 09/29/2016  . Small bowel obstruction ([place])   . Paroxysmal A-fib ([place]) 11/06/2014  . 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 B12 deficiency 11/12/2013  . Hyperlipemia, mixed 10/24/2013  . Hypertension, essential, benign 10/24/2013    presents to clinic for follow-up evaluation of recent SBO attributed to recently incarcerated chronic umbilical hernia, doing well.  Plan:              - medical and cardiology risk stratification and optimization appreciated             -  Xarelto for a-fib without history of embolic event (stroke, GI, or extremity) held  - all risks, benefits, and alternatives to above elective procedure(s) were discussed with the patient and his family, all of their questions were answered to their expressed satisfaction, patient expresses he wishes to proceed, and informed consent was accordingly obtained.             - patient instructed to continue avoiding heavy lifting >15 lbs or strenuous activity and applying abdominal binder in am daily             - plan for laparoscopic repair of large reducible umbilical hernia with mesh Thursday, 7/12  - instructed to call if any questions or concerns  All of the above recommendations were discussed with the patient and patient's family, and all of patient's and family's questions were answered to their expressed satisfaction.  -- Marilynne Drivers Rosana Hoes, MD, [place]: [facility] - Partnering for exceptional care. Office: [phone]

## 2016-11-04 NOTE — Interval H&P Note (Signed)
History and Physical Interval Note:  11/04/2016 7:31 AM  Reginald Phillips  has presented today for surgery, with the diagnosis of UMBILICAL HERNIA  The various methods of treatment have been discussed with the patient and family. After consideration of risks, benefits and other options for treatment, the patient has consented to  Procedure(s): HERNIA REPAIR UMBILICAL ADULT (N/A) as a surgical intervention .  The patient's history has been reviewed, patient examined, no change in status, stable for surgery.  I have reviewed the patient's chart and labs.  Questions were answered to the patient's satisfaction.     Vickie Epley

## 2016-11-04 NOTE — Anesthesia Postprocedure Evaluation (Signed)
Anesthesia Post Note  Patient: Reginald Phillips  Procedure(s) Performed: Procedure(s): LAPAROSCOPIC VENTRAL HERNIA  Patient location during evaluation: PACU Anesthesia Type: General Level of consciousness: awake and alert and oriented Pain management: pain level controlled Vital Signs Assessment: post-procedure vital signs reviewed and stable Respiratory status: spontaneous breathing, nonlabored ventilation and respiratory function stable Cardiovascular status: blood pressure returned to baseline and stable Postop Assessment: no signs of nausea or vomiting Anesthetic complications: no     Last Vitals:  Vitals:   11/04/16 1054 11/04/16 1109  BP: 134/73 122/60  Pulse: 64 (!) 59  Resp: (!) 7 20  Temp: (!) 36.4 C     Last Pain:  Vitals:   11/04/16 1120  TempSrc:   PainSc: 3                  Katriana Dortch

## 2016-11-04 NOTE — Op Note (Signed)
SURGICAL OPERATIVE REPORT  DATE OF PROCEDURE: 11/04/2016  ATTENDING Surgeon(s): Vickie Epley, MD   ASSISTANT(S): Chancy Hurter, PA-S   ANESTHESIA: general   PRE-OPERATIVE DIAGNOSIS: Formerly incarcerated 6 cm x 6 cm umbilical hernia, previously the cause of patient's small bowel obstruction (icd-10's: K42.0)  POST-OPERATIVE DIAGNOSIS: Formerly incarcerated 6 cm x 6 cm umbilical hernia, previously the cause of patient's small bowel obstruction (icd-10's: K42.0)  PROCEDURE(S): (cpt: 08676) 1.) Laparoscopic repair of umbilical hernia with 15 cm x 20 cm Bard Ventralight ST single-side coated mesh for formerly incarcerated umbilical hernia associated with SBO  INTRAOPERATIVE FINDINGS: 6 cm x 6 cm easily reducible umbilical hernia containing omentum and large peritoneal hernia sac at the time of laparoscopic surgery (likely at least partially reduced by insufflated pneumoperitoneum)  INTRAVENOUS FLUIDS: 800 mL crystalloid   ESTIMATED BLOOD LOSS: Minimal (<20 mL)  URINE OUTPUT: No Foley   SPECIMENS: No Specimen  IMPLANTS: Bard 6" x 8" Ventralight ST coated mesh  DRAINS: none  COMPLICATIONS: None apparent  CONDITION AT END OF PROCEDURE: Hemodynamically stable and extubated  DISPOSITION OF PATIENT: PACU  INDICATIONS FOR PROCEDURE:  Patient is a 61 year old obese, diabetic Male with an umbilical hernia x >19 years for which he was previously told not to seek attention unless it bothered him and to instead avoid heavy lifting >10 lbs until he presented to [facility] ED with SBO due to incarcerated umbilical hernia after he lifted three 50 lb bags of chicken feed 3.5 days prior to presentation. At that time, patient also demonstrated atrial fibrillation with RVR and was anticoagulated on Xarelto. Patient's HR was controlled, abdominal binder was applied, SBO resolved, and patient was safely able to be discharged home with appropriate follow-up. Patient's condition has been optimized and his  anticoagulation confirmed okay to hold. All risks, benefits, and alternatives to above procedure were discussed with the patient, all of patient's questions were answered to his expressed satisfaction, and informed consent was obtained and documented.  DETAILS OF PROCEDURE: Patient was brought to the operating suite and appropriately identified. General anesthesia was administered along with appropriate pre-operative antibiotics, and endotracheal intubation was performed by anesthetist. In supine position, operative site was prepped and draped in the usual sterile fashion, and following a brief time out, local anesthetic was injected subcutaneously over Palmer's point 3 cm inferior to the subcostal margin along the mid-clavicular line, at which point using a #11 blade scalpel, a 5 mm incision was made. Fascia was then elevated, and a Verress needle was inserted and its proper position confirmed using aspiration and saline meniscus test.  Upon insufflation of the abdominal cavity with carbon dioxide to a well-tolerated pressure of 12-15 mmHg, 5 mm peri-umbilical port followed by 5 mm 30-degree laparoscope were inserted and used to inspect the abdominal cavity and its contents with no injuries from insertion of the first trochar noted and confirmation of pre-operative diagnosis. Two additional trocars were inserted along the Left side of the abdomen, the middle one being a 12 mm port (through which to insert the hernia repair mesh) and the lower one a second 5 mm port. The hernia contents were then carefully laparoscopically reduced without need to resect any incarcerated omentum or hernia sac. Spinal needle was then used to identify superior, inferior, and two lateral sites (which were each then marked) for placement of lateral anchoring sutures with at least 2 - 3 cm mesh extension beyond the the margins of the hernia defect. Appropriately sized mesh was selected, the  inward-facing coated side of the mesh was  marked to designate 4 quadrants, and an 0-0 Ethibond braided non-absorbable suture was secured at least 1 cm from each of the 4 corners to serve as trans-fascial anchoring sutures. The mesh was wet briefly, rolled, and inserted through the 12 mm port into the abdominal cavity, where it was unrolled, re-oriented, and kept with the coated side down.  PMI laparoscopic fascial closure device was advanced through 2 mm incisions made at each of 4 markings made to signify the hernia repair mesh edges (at least 1 cm away from Ethibond sutures placed at least 1 cm from the edge of the mesh) and used to externalize the anchoring sutures, which were each tied at one time after all had been externalized and upon confirmation of enough tension on the mesh in a trampoline-like fashion. Laparoscopic tacking device was then used to circumferentially secure the edges of the mesh. One additional 5 mm Right-sided port was inserted under direct laparoscopic visualization to facilitate circumferential tacking. Hemostasis and secure placement of hernia repair mesh were confirmed, and intra-peritoneal cavity was inspected with no additional findings.   PMI laparoscopic fascial closure device was then used to re-approximate fascia at the 12 mm port site. All ports were then removed under direct visualization, and the abdominal cavity was desuflated. All port sites were irrigated/cleaned, additional local anesthetic was injected at each incision, 3-0 Vicryl was used to re-approximate dermis at 12 mm port site, and subcuticular 4-0 Monocryl suture was used to re-approximate skin. Skin was then cleaned, dried, and sterile skin glue was applied. Patient was then safely able to be extubated, awakened, and transferred to PACU for post-operative monitoring and care.  I was present for all aspects of the above procedure, and there were no complications apparent.

## 2016-11-04 NOTE — Discharge Instructions (Signed)
In addition to included general post-operative instructions for Laparoscopic Repair with Mesh of Umbilical Hernia,  Diet: Resume home heart healthy diet.   Activity: No heavy lifting >15 pounds (children, pets, laundry, garbage) or strenuous activity until follow-up, but light activity and walking are encouraged. Do not drive or drink alcohol if taking narcotic pain medications. No heavy lifting >40 lbs x 6 weeks.  Wound care: Do Not Remove Abdominal Binder until 3 Days After Surgery. Once abdominal binder has been removed (Sunday, 7/15), you may shower/get incision wet with soapy water and pat dry (do not rub incisions), but no baths or submerging incision underwater until follow-up. After showering and drying skin, continue to wear abdominal binder daily for total of 6 weeks after surgery (may remove binder when sleeping).  Medications: Resume all home medications wait 3 days after surgery before resuming Xarelto anticoagulation. For mild to moderate pain: acetaminophen (Tylenol) or ibuprofen (if no kidney disease). Combining Tylenol with alcohol can substantially increase your risk of causing liver disease. Narcotic pain medications, if prescribed, can be used for severe pain, though may cause nausea, constipation, and drowsiness. Do not combine Tylenol and Percocet within a 6 hour period as Percocet contains Tylenol. If you do not need the narcotic pain medication, you do not need to fill the prescription.  Call office [phone]) at any time if any questions, worsening pain, fevers/chills, bleeding, drainage from incision site, or other concerns.

## 2016-11-04 NOTE — Anesthesia Preprocedure Evaluation (Signed)
Anesthesia Evaluation  Patient identified by MRN, date of birth, ID band Patient awake    Reviewed: Allergy & Precautions, NPO status , Patient's Chart, lab work & pertinent test results  History of Anesthesia Complications Negative for: history of anesthetic complications  Airway Mallampati: II  TM Distance: >3 FB Neck ROM: Full    Dental  (+) Edentulous Upper, Edentulous Lower   Pulmonary neg pulmonary ROS, neg sleep apnea, neg COPD,    breath sounds clear to auscultation- rhonchi (-) wheezing      Cardiovascular hypertension, Pt. on medications (-) CAD, (-) Past MI and (-) Cardiac Stents + dysrhythmias Atrial Fibrillation  Rhythm:Regular Rate:Normal - Systolic murmurs and - Diastolic murmurs    Neuro/Psych negative neurological ROS  negative psych ROS   GI/Hepatic negative GI ROS, Neg liver ROS,   Endo/Other  diabetes, Insulin Dependent  Renal/GU Renal InsufficiencyRenal disease     Musculoskeletal negative musculoskeletal ROS (+)   Abdominal (+) + obese,   Peds  Hematology negative hematology ROS (+)   Anesthesia Other Findings Past Medical History: 11/12/2013: B12 deficiency No date: Chronic kidney disease     Comment:  50 % funtion from too much metformin No date: Diabetes mellitus without complication (HCC) No date: Dysrhythmia No date: Hernia cerebri (HCC) 10/24/2013: Hyperlipemia, mixed No date: Hypertension 10/24/2013: Hypertension, essential, benign No date: Neuropathy 11/06/2014: Paroxysmal A-fib (HCC) No date: Small bowel obstruction (HCC) 05/01/2014: Uncontrolled type 2 diabetes with neuropathy (HCC)   Reproductive/Obstetrics                             Anesthesia Physical  Anesthesia Plan  ASA: III  Anesthesia Plan: General   Post-op Pain Management:    Induction: Intravenous  PONV Risk Score and Plan: 1 and Ondansetron and Dexamethasone  Airway Management  Planned: Oral ETT  Additional Equipment:   Intra-op Plan:   Post-operative Plan: Extubation in OR  Informed Consent: I have reviewed the patients History and Physical, chart, labs and discussed the procedure including the risks, benefits and alternatives for the proposed anesthesia with the patient or authorized representative who has indicated his/her understanding and acceptance.   Dental advisory given  Plan Discussed with: CRNA and Anesthesiologist  Anesthesia Plan Comments:         Anesthesia Quick Evaluation  

## 2016-11-05 ENCOUNTER — Telehealth: Payer: Self-pay

## 2016-11-05 NOTE — Telephone Encounter (Signed)
Post-op call made to patient at this time. Spoke with Lurline Del, patient's wife. Post-op interview questions below.  1. How are you feeling? Having soreness  2. Is your pain controlled? Yes  3. What are you doing for the pain? Taking tyenol  4. Are you having any Nausea or Vomiting? None  5. Are you having any Fever or Chills? None  6. Are you having any Constipation or Diarrhea? Hasn't had a bowel movement, informed her to tell patient to try some colace if his bowel movements don't begin to move within a couple of days.  7. Is there any Swelling or Bruising you are concerned about? None  8. Do you have any questions or concerns at this time? none   Discussion: Reminded of appointment on 7/26 at 3:30PM patient's wife verbalized understand at this time

## 2016-11-18 ENCOUNTER — Ambulatory Visit (INDEPENDENT_AMBULATORY_CARE_PROVIDER_SITE_OTHER): Payer: BLUE CROSS/BLUE SHIELD | Admitting: Surgery

## 2016-11-18 ENCOUNTER — Encounter: Payer: Self-pay | Admitting: Surgery

## 2016-11-18 VITALS — BP 108/65 | HR 96 | Temp 98.1°F | Wt 238.0 lb

## 2016-11-18 DIAGNOSIS — Z09 Encounter for follow-up examination after completed treatment for conditions other than malignant neoplasm: Secondary | ICD-10-CM

## 2016-11-18 NOTE — Patient Instructions (Addendum)
Please do not submerge in a tub, hot tub, or pool until incisions are completely sealed.  Use sun block to incision area over the next year if this area will be exposed to sun. This helps decrease scarring.  You may resume your normal activities on 12/03/2015. At that time- Listen to your body when lifting, if you have pain when lifting, stop and then try again in a few days. Soreness after doing exercises or activities of daily living is normal as you get back in to your normal routine.  No mowing at this time. Please wait until you see Dr. Rosana Hoes.  If you develop redness, drainage, or pain at incision sites- call our office immediately and speak with a nurse.  Please call our office with any questions or concerns that you may have.

## 2016-11-18 NOTE — Progress Notes (Signed)
S/p lap UH w mesh repair Dr. Rosana Hoes 7/12 Doing well. No complaints today Taking po , pain controlled  PE NAD Abd: soft, NT, there is a seroma where previous UH was. No evidence of infection or recurrence. No peritonitis  A/p Doing well Seroma at defect site We will watch closely F/U Dr. Rosana Hoes 2 weeks

## 2016-12-15 ENCOUNTER — Ambulatory Visit (INDEPENDENT_AMBULATORY_CARE_PROVIDER_SITE_OTHER): Payer: BLUE CROSS/BLUE SHIELD | Admitting: Surgery

## 2016-12-15 ENCOUNTER — Encounter: Payer: Self-pay | Admitting: Surgery

## 2016-12-15 VITALS — BP 136/83 | HR 67 | Temp 97.9°F | Ht 71.0 in | Wt 242.2 lb

## 2016-12-15 DIAGNOSIS — K420 Umbilical hernia with obstruction, without gangrene: Secondary | ICD-10-CM

## 2016-12-15 NOTE — Patient Instructions (Signed)
GENERAL POST-OPERATIVE PATIENT INSTRUCTIONS   WOUND CARE INSTRUCTIONS:  Keep a dry clean dressing on the wound if there is drainage. The initial bandage may be removed after 24 hours.  Once the wound has quit draining you may leave it open to air.  If clothing rubs against the wound or causes irritation and the wound is not draining you may cover it with a dry dressing during the daytime.  Try to keep the wound dry and avoid ointments on the wound unless directed to do so.  If the wound becomes bright red and painful or starts to drain infected material that is not clear, please contact your physician immediately.  If the wound is mildly pink and has a thick firm ridge underneath it, this is normal, and is referred to as a healing ridge.  This will resolve over the next 4-6 weeks.  BATHING: You may shower if you have been informed of this by your surgeon. However, Please do not submerge in a tub, hot tub, or pool until incisions are completely sealed or have been told by your surgeon that you may do so.  DIET:  You may eat any foods that you can tolerate.  It is a good idea to eat a high fiber diet and take in plenty of fluids to prevent constipation.  If you do become constipated you may want to take a mild laxative or take ducolax tablets on a daily basis until your bowel habits are regular.  Constipation can be very uncomfortable, along with straining, after recent surgery.  ACTIVITY:  You are encouraged to cough and deep breath or use your incentive spirometer if you were given one, every 15-30 minutes when awake.  This will help prevent respiratory complications and low grade fevers post-operatively if you had a general anesthetic.  You may want to hug a pillow when coughing and sneezing to add additional support to the surgical area, if you had abdominal or chest surgery, which will decrease pain during these times.  You are encouraged to walk and engage in light activity for the next two weeks.  You  should not lift more than 20 pounds, until 12/23/2016 as it could put you at increased risk for complications.  Twenty pounds is roughly equivalent to a plastic bag of groceries. At that time- Listen to your body when lifting, if you have pain when lifting, stop and then try again in a few days. Soreness after doing exercises or activities of daily living is normal as you get back in to your normal routine.  MEDICATIONS:  Try to take narcotic medications and anti-inflammatory medications, such as tylenol, ibuprofen, naprosyn, etc., with food.  This will minimize stomach upset from the medication.  Should you develop nausea and vomiting from the pain medication, or develop a rash, please discontinue the medication and contact your physician.  You should not drive, make important decisions, or operate machinery when taking narcotic pain medication.  SUNBLOCK Use sun block to incision area over the next year if this area will be exposed to sun. This helps decrease scarring and will allow you avoid a permanent darkened area over your incision.  QUESTIONS:  Please feel free to call our office if you have any questions, and we will be glad to assist you. [phone]    

## 2016-12-15 NOTE — Progress Notes (Signed)
Surgical Clinic Progress/Follow-up Note   HPI:  61 y.o. Male presents to clinic for post-op follow-up evaluation 5 weeks s/p laparoscopic repair of chronic symptomatic and formerly incarcerated large umbilical hernia with mesh. Patient reports he feels completely better with resolved peri-umbilical abdominal pain, passing +flatus and +BM WNL, denies N/V, fever/chills, CP, or SOB. He further states that he has continued to wear his abdominal binder daily and feels better while wearing it even after his surgery. He denies lifting >40 lbs.  Review of Systems:  Constitutional: denies any other weight loss, fever, chills, or sweats  Eyes: denies any other vision changes, history of eye injury  ENT: denies sore throat, hearing problems  Respiratory: denies shortness of breath, wheezing  Cardiovascular: denies chest pain, palpitations  Gastrointestinal: abdominal pain, N/V, and bowel function as per HPI Musculoskeletal: denies any other joint pains or cramps  Skin: Denies any other rashes or skin discolorations  Neurological: denies any other headache, dizziness, weakness  Psychiatric: denies any other depression, anxiety  All other review of systems: otherwise negative   Vital Signs:  BP 136/83   Pulse 67   Temp 97.9 F (36.6 C) (Oral)   Ht 5\' 11"  (1.803 m)   Wt 242 lb 3.2 oz (109.9 kg)   BMI 33.78 kg/m    Physical Exam:  Constitutional:  -- Obese body habitus  -- Awake, alert, and oriented x3  Eyes:  -- Pupils equally round and reactive to light  -- No scleral icterus  Ear, nose, throat:  -- No jugular venous distension  -- No nasal drainage, bleeding Pulmonary:  -- No crackles -- Equal breath sounds bilaterally -- Breathing non-labored at rest Cardiovascular:  -- S1, S2 present  -- No pericardial rubs  Gastrointestinal:  -- Soft, nontender, nondistended, no guarding/rebound, residually loose skin over umbilicus without recurrent hernia -- No abdominal masses  appreciated, pulsatile or otherwise  Musculoskeletal / Integumentary:  -- Wounds or skin discoloration: None appreciated except as described above (GI)  -- Extremities: B/L UE and LE FROM, hands and feet warm  Neurologic:  -- Motor function: intact and symmetric  -- Sensation: intact and symmetric   Laboratory studies:  CBC:  Lab Results  Component Value Date   WBC 8.6 10/28/2016   RBC 5.21 10/28/2016   BMP:  Lab Results  Component Value Date   GLUCOSE 143 (H) 10/28/2016   CO2 28 10/28/2016   BUN 17 10/28/2016   CREATININE 1.21 10/28/2016   CALCIUM 9.5 10/28/2016   Coagulation:  Lab Results  Component Value Date   INR 1.32 09/30/2016   APTT 64 (H) 10/01/2016   Imaging: No new pertinent imaging studies   Assessment:  61 y.o. yo Male with a problem list including...  Patient Active Problem List   Diagnosis Date Noted  . Persistent atrial fibrillation ([place]) 10/18/2016  . Periumbilical hernia   . Atrial fibrillation with rapid ventricular response ([place])   . Umbilical hernia with obstruction but no gangrene 10/06/2016  . Incarcerated hernia 09/29/2016  . Small bowel obstruction ([place])   . Paroxysmal A-fib ([place]) 11/06/2014  . Uncontrolled type 2 diabetes with neuropathy ([place]) 05/01/2014  . B12 deficiency 11/12/2013  . Hyperlipemia, mixed 10/24/2013  . Hypertension, essential, benign 10/24/2013    presents to clinic, doing well 5 weeks s/p laparoscopic repair of large and symptomatic umbilical hernia (associated with former SBO) with mesh.  Plan:   - may continue to wear abdominal binder prn   - resume all activities of daily  living without restrictions in 1 week, but don't overdo considering back and hip pain  - weight loss along with walking and light activities are certainly encouraged  - return to clinic as needed, call office if any questions or concerns  All of the above recommendations were discussed with the patient and patient's family, and all of patient's  and family's questions were answered to their expressed satisfaction.  -- Marilynne Drivers Rosana Hoes, MD, [place]: [facility] - Partnering for exceptional care. Office: [phone]

## 2017-04-07 ENCOUNTER — Telehealth: Payer: Self-pay | Admitting: Surgery

## 2017-04-07 NOTE — Telephone Encounter (Signed)
States that his incision where his hernia was repaired is currently bulging out about 2-3 inches. I asked if he is having any pain and if he was able to push the area back in. He verbalized that he is have some slight pain but is not able to push the area back in. Denies nasuea, vomiting, fever/chills, constipation/diarrhea. He did verbalized that when he touches the are that it feels that there is fluid in that area. I offered an appointment for him to be seen tomorrow. Patient stated that he would wait until Monday to be seen with Dr. Rosana Hoes. I verbalized understanding and stated to him that if this area increases in size, increase in pain, redness, drainage occurs, and/or start to have symptoms such as nasuea, vomiting, fever/chills, constipation/diarrhea to please go to the ER as soon as possible. Patient verbalized understanding and confirmed appointment for Monday at [facility] with Dr. Rosana Hoes.

## 2017-04-07 NOTE — Telephone Encounter (Signed)
Patient has called and left a message on voicemail at 2:40pm. He stated that he would like a nurse to call him back. He did not state a reason.   He was last seen with Dr Rosana Hoes on 12/15/16 for post op of a lap repair of umbilical hernia on 3/97/95.

## 2017-04-11 ENCOUNTER — Encounter: Payer: Self-pay | Admitting: Surgery

## 2017-04-11 ENCOUNTER — Ambulatory Visit: Payer: BLUE CROSS/BLUE SHIELD | Admitting: Surgery

## 2017-04-11 VITALS — BP 120/75 | HR 82 | Temp 97.7°F | Ht 71.0 in | Wt 243.0 lb

## 2017-04-11 DIAGNOSIS — K429 Umbilical hernia without obstruction or gangrene: Secondary | ICD-10-CM | POA: Diagnosis not present

## 2017-04-11 NOTE — Patient Instructions (Signed)
We advised that you start wearing your abdominal binder. We will see you back in office as listed below. Also listed below is low-fat diet tips to help with weight loss.   Please follow up with your Primary Care Provider for the Psoriasis as well.    Heart-Healthy Eating Plan Heart-healthy meal planning includes:  Limiting unhealthy fats.  Increasing healthy fats.  Making other small dietary changes.  You may need to talk with your doctor or a diet specialist (dietitian) to create an eating plan that is right for you. What types of fat should I choose?  Choose healthy fats. These include olive oil and canola oil, flaxseeds, walnuts, almonds, and seeds.  Eat more omega-3 fats. These include salmon, mackerel, sardines, tuna, flaxseed oil, and ground flaxseeds. Try to eat fish at least twice each week.  Limit saturated fats. ? Saturated fats are often found in animal products, such as meats, butter, and cream. ? Plant sources of saturated fats include palm oil, palm kernel oil, and coconut oil.  Avoid foods with partially hydrogenated oils in them. These include stick margarine, some tub margarines, cookies, crackers, and other baked goods. These contain trans fats. What general guidelines do I need to follow?  Check food labels carefully. Identify foods with trans fats or high amounts of saturated fat.  Fill one half of your plate with vegetables and green salads. Eat 4-5 servings of vegetables per day. A serving of vegetables is: ? 1 cup of raw leafy vegetables. ?  cup of raw or cooked cut-up vegetables. ?  cup of vegetable juice.  Fill one fourth of your plate with whole grains. Look for the word "whole" as the first word in the ingredient list.  Fill one fourth of your plate with lean protein foods.  Eat 4-5 servings of fruit per day. A serving of fruit is: ? One medium whole fruit. ?  cup of dried fruit. ?  cup of fresh, frozen, or canned fruit. ?  cup of 100% fruit  juice.  Eat more foods that contain soluble fiber. These include apples, broccoli, carrots, beans, peas, and barley. Try to get 20-30 g of fiber per day.  Eat more home-cooked food. Eat less restaurant, buffet, and fast food.  Limit or avoid alcohol.  Limit foods high in starch and sugar.  Avoid fried foods.  Avoid frying your food. Try baking, boiling, grilling, or broiling it instead. You can also reduce fat by: ? Removing the skin from poultry. ? Removing all visible fats from meats. ? Skimming the fat off of stews, soups, and gravies before serving them. ? Steaming vegetables in water or broth.  Lose weight if you are overweight.  Eat 4-5 servings of nuts, legumes, and seeds per week: ? One serving of dried beans or legumes equals  cup after being cooked. ? One serving of nuts equals 1 ounces. ? One serving of seeds equals  ounce or one tablespoon.  You may need to keep track of how much salt or sodium you eat. This is especially true if you have high blood pressure. Talk with your doctor or dietitian to get more information. What foods can I eat? Grains Breads, including [place], white, pita, wheat, raisin, rye, oatmeal, and [place]. Tortillas that are neither fried nor made with lard or trans fat. Low-fat rolls, including hotdog and hamburger buns and English muffins. Biscuits. Muffins. Waffles. Pancakes. Light popcorn. Whole-grain cereals. Flatbread. Melba toast. Pretzels. Breadsticks. Rusks. Low-fat snacks. Low-fat crackers, including oyster, saltine,  matzo, graham, animal, and rye. Rice and pasta, including brown rice and pastas that are made with whole wheat. Vegetables All vegetables. Fruits All fruits, but limit coconut. Meats and Other Protein Sources Lean, well-trimmed beef, veal, pork, and lamb. Chicken and [place] without skin. All fish and shellfish. Wild duck, rabbit, pheasant, and venison. Egg whites or low-cholesterol egg substitutes. Dried beans, peas, lentils,  and tofu. Seeds and most nuts. Dairy Low-fat or nonfat cheeses, including ricotta, string, and mozzarella. Skim or 1% milk that is liquid, powdered, or evaporated. Buttermilk that is made with low-fat milk. Nonfat or low-fat yogurt. Beverages Mineral water. Diet carbonated beverages. Sweets and Desserts Sherbets and fruit ices. Honey, jam, marmalade, jelly, and syrups. Meringues and gelatins. Pure sugar candy, such as hard candy, jelly beans, gumdrops, mints, marshmallows, and small amounts of dark chocolate. [employer] Eat all sweets and desserts in moderation. Fats and Oils Nonhydrogenated (trans-free) margarines. Vegetable oils, including soybean, sesame, sunflower, olive, peanut, safflower, corn, canola, and cottonseed. Salad dressings or mayonnaise made with a vegetable oil. Limit added fats and oils that you use for cooking, baking, salads, and as spreads. Other Cocoa powder. Coffee and tea. All seasonings and condiments. The items listed above may not be a complete list of recommended foods or beverages. Contact your dietitian for more options. What foods are not recommended? Grains Breads that are made with saturated or trans fats, oils, or whole milk. Croissants. Butter rolls. Cheese breads. Sweet rolls. Donuts. Buttered popcorn. Chow mein noodles. High-fat crackers, such as cheese or butter crackers. Meats and Other Protein Sources Fatty meats, such as hotdogs, short ribs, sausage, spareribs, bacon, rib eye roast or steak, and mutton. High-fat deli meats, such as salami and bologna. Caviar. Domestic duck and goose. Organ meats, such as kidney, liver, sweetbreads, and heart. Dairy Cream, sour cream, cream cheese, and creamed cottage cheese. Whole-milk cheeses, including blue (bleu), Monte[place] Jack, [place], [place], American, [place], Swiss, cheddar, [place], and [place]. Whole or 2% milk that is liquid, evaporated, or condensed. Whole buttermilk. Cream sauce or high-fat cheese sauce.  Yogurt that is made from whole milk. Beverages Regular sodas and juice drinks with added sugar. Sweets and Desserts Frosting. Pudding. Cookies. Cakes other than angel food cake. Candy that has milk chocolate or white chocolate, hydrogenated fat, butter, coconut, or unknown ingredients. Buttered syrups. Full-fat ice cream or ice cream drinks. Fats and Oils Gravy that has suet, meat fat, or shortening. Cocoa butter, hydrogenated oils, palm oil, coconut oil, palm kernel oil. These can often be found in baked products, candy, fried foods, nondairy creamers, and whipped toppings. Solid fats and shortenings, including bacon fat, salt pork, lard, and butter. Nondairy cream substitutes, such as coffee creamers and sour cream substitutes. Salad dressings that are made of unknown oils, cheese, or sour cream. The items listed above may not be a complete list of foods and beverages to avoid. Contact your dietitian for more information. This information is not intended to replace advice given to you by your health care provider. Make sure you discuss any questions you have with your health care provider. Document Released: 10/12/2011 Document Revised: 09/18/2015 Document Reviewed: 10/04/2013 Elsevier Interactive Patient Education  [employer].

## 2017-04-11 NOTE — Progress Notes (Signed)
Surgical Clinic Progress/Follow-up Note   HPI:  61 y.o. obese Male presents to clinic for evaluation of a recurrent umbilical hernia. Patient reports he nearly 6 months ago underwent laparoscopic repair of his large umbilical hernia. He was last seen for post-surgical follow-up, doing well 62 mont following surgery. Patient says he'd returned to his normal activities without restrictions and was feeling well until 1 - 2 weeks ago he noticed an increasing bulge to the Left of his prior umbilical hernia and says the area is somewhat sore. When he called our office 4 days ago, he stated that he could not push the area back in, but he now says it goes back in by itself when he lays down. He also says he has been advised by his medical physician to loose significant weight in order to better control his diabetes and says he has been trying to make an effort to do so, adding that "it is hard to do around the holidays". He otherwise denies any constipation, diarrhea, N/V, fever/chills, CP, or SOB.  Review of Systems:  Constitutional: denies any other weight loss, fever, chills, or sweats  Eyes: denies any other vision changes, history of eye injury  ENT: denies sore throat, hearing problems  Respiratory: denies shortness of breath, wheezing  Cardiovascular: denies chest pain, palpitations  Gastrointestinal: abdominal pain, N/V, and bowel function as per HPI Musculoskeletal: denies any other joint pains or cramps  Skin: Denies any other rashes or skin discolorations  Neurological: denies any other headache, dizziness, weakness  Psychiatric: denies any other depression, anxiety  All other review of systems: otherwise negative   Vital Signs:  BP 120/75   Pulse 82   Temp 97.7 F (36.5 C) (Oral)   Ht 5\' 11"  (1.803 m)   Wt 243 lb (110.2 kg)   BMI 33.89 kg/m    Physical Exam:  Constitutional:  -- Obese body habitus  -- Awake, alert, and oriented x3  Eyes:  -- Pupils equally round and reactive  to light  -- No scleral icterus  Ear, nose, throat:  -- No jugular venous distension  -- No nasal drainage, bleeding Pulmonary:  -- No crackles -- Equal breath sounds bilaterally -- Breathing non-labored at rest Cardiovascular:  -- S1, S2 present  -- No pericardial rubs  Gastrointestinal:  -- Two scaly patches at LUQ and over Right side of umbilicus, consistent with psoriasis -- Soft, protuberant abdomen with minimal peri-umbilical tenderness with reduction of easily reducible umbilical hernia, no guarding/rebound tenderness; well-heale laparoscopic port sites without erythema -- No abdominal masses appreciated, pulsatile or otherwise  Musculoskeletal / Integumentary:  -- Wounds or skin discoloration: None appreciated except as described above (GI)  -- Extremities: B/L UE and LE FROM, hands and feet warm  Neurologic:  -- Motor function: intact and symmetric  -- Sensation: intact and symmetric   Laboratory studies:  CBC:  Lab Results  Component Value Date   WBC 8.6 10/28/2016   RBC 5.21 10/28/2016   BMP:  Lab Results  Component Value Date   GLUCOSE 143 (H) 10/28/2016   CO2 28 10/28/2016   BUN 17 10/28/2016   CREATININE 1.21 10/28/2016   CALCIUM 9.5 10/28/2016     Assessment:  61 y.o. yo Male with a problem list including...  Patient Active Problem List   Diagnosis Date Noted  . Persistent atrial fibrillation ([place]) 10/18/2016  . Periumbilical hernia   . Atrial fibrillation with rapid ventricular response ([place])   . Umbilical hernia with obstruction but no  gangrene 10/06/2016  . Incarcerated hernia 09/29/2016  . Small bowel obstruction ([place])   . Paroxysmal A-fib ([place]) 11/06/2014  . 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 B12 deficiency 11/12/2013  . Hyperlipemia, mixed 10/24/2013  . Hypertension, essential, benign 10/24/2013    presents to clinic with recurrent umbilical hernia nearly 6 months s/p laparoscopic repair of a large umbilical  hernia associated at that time with SBO, complicated most notably by morbid obesity (BMI: 34).  Plan:   - abdominal binder advised until surgery  - weight loss strongly encouraged both for diabetes and to reduce risk of recurrence  - return to clinic in mid-February to follow-up weight loss and likely schedule laparoscopic repair of recurrent ventral hernia  - anticipate will obtain CT imaging at that time for pre-operative planning to assess prior mesh  - medical management of what appears to be psoriasis over umbilicus and LUQ per PMD  - instructed to call office if any questions or concerns  All of the above recommendations were discussed with the patient and patient's wife, and all of patient's and family's questions were answered to his expressed satisfaction.  -- Marilynne Drivers Rosana Hoes, MD, [place]: [facility] - Partnering for exceptional care. Office: [phone]

## 2017-04-12 ENCOUNTER — Encounter: Payer: Self-pay | Admitting: Surgery

## 2017-06-09 ENCOUNTER — Encounter: Payer: Self-pay | Admitting: Surgery

## 2017-06-09 ENCOUNTER — Ambulatory Visit: Payer: BLUE CROSS/BLUE SHIELD | Admitting: Surgery

## 2017-06-09 VITALS — BP 112/82 | HR 64 | Temp 97.0°F | Ht 71.0 in | Wt 244.0 lb

## 2017-06-09 DIAGNOSIS — K460 Unspecified abdominal hernia with obstruction, without gangrene: Secondary | ICD-10-CM

## 2017-06-09 DIAGNOSIS — K429 Umbilical hernia without obstruction or gangrene: Secondary | ICD-10-CM

## 2017-06-09 NOTE — H&P (View-Only) (Signed)
Surgical Clinic Progress/Follow-up Note   HPI:  62 y.o. Male presents to clinic for follow-up evaluation of his large and recurrent umbilical hernia. Patient reports it continues to be sore, though he continues to pass flatus and BM, denies constipation, frequent coughing, or straining with urination. He says he continues to wear his abdominal binder, but adds that the binder frequently slips down below the level of his hernia. He also says he has not lost any weight, reiterating the past several months have been a difficult time at which to lose weight. He also says he continues to ambulate and ascend/descend a flight of steps without any CP or SOB.  Review of Systems:  Constitutional: denies any other weight loss, fever, chills, or sweats  Eyes: denies any other vision changes, history of eye injury  ENT: denies sore throat, hearing problems  Respiratory: denies shortness of breath, wheezing  Cardiovascular: denies chest pain, palpitations  Gastrointestinal: abdominal pain, N/V, and bowel function as per HPI Musculoskeletal: denies any other joint pains or cramps  Skin: Denies any other rashes or skin discolorations  Neurological: denies any other headache, dizziness, weakness  Psychiatric: denies any other depression, anxiety  All other review of systems: otherwise negative   Vital Signs:  BP 112/82   Pulse 64   Temp (!) 97 F (36.1 C) (Oral)   Ht 5\' 11"  (1.803 m)   Wt 244 lb (110.7 kg)   BMI 34.03 kg/m    Physical Exam:  Constitutional:  -- Obese body habitus  -- Awake, alert, and oriented x3  Eyes:  -- Pupils equally round and reactive to light  -- No scleral icterus  Ear, nose, throat:  -- No jugular venous distension  -- No nasal drainage, bleeding Pulmonary:  -- No crackles -- Equal breath sounds bilaterally -- Breathing non-labored at rest Cardiovascular:  -- S1, S2 present  -- No pericardial rubs  Gastrointestinal:  -- Soft, nontender, non-distended, no  guarding/rebound  -- Reducible large umbilical hernia, more to the Left of palpable mid-umbilical mesh, likely displaced towards patient's Right side, abdominal binder around patient's waist below level of umbilical hernia -- No other abdominal masses appreciated, pulsatile or otherwise  Musculoskeletal / Integumentary:  -- Wounds or skin discoloration: None appreciated except as described above (GI)  -- Extremities: B/L UE and LE FROM, hands and feet warm  Neurologic:  -- Motor function: intact and symmetric  -- Sensation: intact and symmetric   Laboratory studies:  CBC Latest Ref Rng & Units 10/28/2016 10/02/2016 10/01/2016  WBC 3.8 - 10.6 K/uL 8.6 5.6 7.0  Hemoglobin 13.0 - 18.0 g/dL 14.4 13.2 13.3  Hematocrit 40.0 - 52.0 % 42.9 39.0(L) 39.4(L)  Platelets 150 - 440 K/uL 183 181 189   CMP Latest Ref Rng & Units 10/28/2016 10/01/2016 09/30/2016  Glucose 65 - 99 mg/dL 143(H) 190(H) 124(H)  BUN 6 - 20 mg/dL 17 15 22(H)  Creatinine 0.61 - 1.24 mg/dL 1.21 1.03 1.15  Sodium 135 - 145 mmol/L 137 135 137  Potassium 3.5 - 5.1 mmol/L 4.4 3.7 3.8  Chloride 101 - 111 mmol/L 102 100(L) 102  CO2 22 - 32 mmol/L 28 28 24   Calcium 8.9 - 10.3 mg/dL 9.5 7.8(L) 8.5(L)  Total Protein 6.5 - 8.1 g/dL 6.9 - 6.2(L)  Total Bilirubin 0.3 - 1.2 mg/dL 0.6 - 1.2  Alkaline Phos 38 - 126 U/L 47 - 39  AST 15 - 41 U/L 23 - 17  ALT 17 - 63 U/L 11(L) - 8(L)  Imaging: no new pertinent imaging studies available for review   Assessment:  62 y.o. yo Male with a problem list including...  Patient Active Problem List   Diagnosis Date Noted  . Persistent atrial fibrillation ([place]) 10/18/2016  . Periumbilical hernia   . Atrial fibrillation with rapid ventricular response ([place])   . Umbilical hernia with obstruction but no gangrene 10/06/2016  . Incarcerated hernia 09/29/2016  . Small bowel obstruction ([place])   . Paroxysmal A-fib ([place]) 11/06/2014  . 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 B12  deficiency 11/12/2013  . Hyperlipemia, mixed 10/24/2013  . Hypertension, essential, benign 10/24/2013    presents to clinic for follow-up evaluation of recurrent umbilical hernia 7 months s/p laparoscopic repair of a large umbilical hernia associated at the time of presentation with SBO, complicated most notably by morbid obesity (BMI >34) and otherwise as above.  Plan:   - will check CT abdomen to assess prior mesh repair  - all risks, benefits, and alternatives to laparoscopic mesh repair of recurrent umbilical/ventral abdominal wall hernia were discussed with the patient and his wife, all of their questions were answered to their expressed satisfaction, patient expresses he wishes to proceed, and informed consent was accordingly obtained.  - will plan for re-operative laparoscopic repair of ventral/umbilical hernia with mesh  - weight loss again once more strongly encouraged to reduce risk of recurrence  - anticipate return to clinic 2 weeks after above elective procedure  - instructed to call office if any questions or concerns  All of the above recommendations were discussed with the patient and patient's family, and all of patient's and family's questions were answered to their expressed satisfaction.  -- Marilynne Drivers Rosana Hoes, MD, [place]: [facility] - Partnering for exceptional care. Office: [phone]

## 2017-06-09 NOTE — Patient Instructions (Signed)
We have scheduled you for a CT Scan of your Abdomen and Pelvis. This has been scheduled at 2/26 at our [place] location. Please Check-in at 8:15, 15 minutes prior to your scheduled appointment. If you need to reschedule your Scan, you may do so by calling [phone].   You will need to pick up a prep kit at least 24 hours in advance of your Scan: You may pick this up at the [place] department at [facility], or [employer].  Bring a list of medications with you to your appointment and you may have nothing to eat or drink 4 hours prior to your CT Scan.   We have ordered some labs to be drawn today. Please proceed to the [facility] to have these tests completed prior to leaving today. You will check in at the registration desk in the medical mall. Please see walking directions below if needed.  We will call you with the results and next step in plan of care as soon as results are received.   Directions to Medical Mall: When leaving our office, go right. Go all of the way down to the very end of the hallway. You will have a purple wall in front of you. You will now have a tunnel to the hospital on your left hand side. Go through this tunnel and the elevators will be on your left. Go down to the 1st floor and take a slight left. The very first desk on the right hand side is the registration desk.    You have requested to have a Ventral Hernia Repair. This will be done on 3/6  by Dr. Rosana Hoes at [facility]. Please see your (BLUE) Pre-care sheet for more information.  You will need to arrange to be out of work for approximately 1-2 weeks and then you may return with a lifting restriction for 4 more weeks. If you have FMLA or Disability paperwork that needs to be filled out, please have your company fax your paperwork to [phone] or you may drop this by either office. This paperwork will be filled out within 3 days after your surgery has been  completed.  Ventral Hernia A ventral hernia (also called an incisional hernia) is a hernia that occurs at the site of a previous surgical cut (incision) in the abdomen. The abdominal wall spans from your lower chest down to your pelvis. If the abdominal wall is weakened from a surgical incision, a hernia can occur. A hernia is a bulge of bowel or muscle tissue pushing out on the weakened part of the abdominal wall. Ventral hernias can get bigger from straining or lifting. Obese and older people are at higher risk for a ventral hernia. People who develop infections after surgery or require repeat incisions at the same site on the abdomen are also at increased risk. CAUSES  A ventral hernia occurs because of weakness in the abdominal wall at an incision site.  SYMPTOMS  Common symptoms include:  A visible bulge or lump on the abdominal wall.  Pain or tenderness around the lump.  Increased discomfort if you cough or make a sudden movement. If the hernia has blocked part of the intestine, a serious complication can occur (incarcerated or strangulated hernia). This can become a problem that requires emergency surgery because the blood flow to the blocked intestine may be cut off. Symptoms may include:  Feeling sick to your stomach (nauseous).  Throwing up (vomiting).  Stomach swelling (distention)  or bloating.  Fever.  Rapid heartbeat. DIAGNOSIS  Your health care provider will take a medical history and perform a physical exam. Various tests may be ordered, such as:  Blood tests.  Urine tests.  Ultrasonography.  X-rays.  Computed tomography (CT). TREATMENT  Watchful waiting may be all that is needed for a smaller hernia that does not cause symptoms. Your health care provider may recommend the use of a supportive belt (truss) that helps to keep the abdominal wall intact. For larger hernias or those that cause pain, surgery to repair the hernia is usually recommended. If a hernia  becomes strangulated, emergency surgery needs to be done right away. HOME CARE INSTRUCTIONS  Avoid putting pressure or strain on the abdominal area.  Avoid heavy lifting.  Use good body positioning for physical tasks. Ask your health care provider about proper body positioning.  Use a supportive belt as directed by your health care provider.  Maintain a healthy weight.  Eat foods that are high in fiber, such as whole grains, fruits, and vegetables. Fiber helps prevent difficult bowel movements (constipation).  Drink enough fluids to keep your urine clear or pale yellow.  Follow up with your health care provider as directed. SEEK MEDICAL CARE IF:   Your hernia seems to be getting larger or more painful. SEEK IMMEDIATE MEDICAL CARE IF:   You have abdominal pain that is sudden and sharp.  Your pain becomes severe.  You have repeated vomiting.  You are sweating a lot.  You notice a rapid heartbeat.  You develop a fever. MAKE SURE YOU:   Understand these instructions.  Will watch your condition.  Will get help right away if you are not doing well or get worse.   This information is not intended to replace advice given to you by your health care provider. Make sure you discuss any questions you have with your health care provider.   Document Released: 03/29/2012 Document Revised: 05/03/2014 Document Reviewed: 03/29/2012 Elsevier Interactive Patient Education 2016 Elsevier Inc[employer]    Laparoscopic Ventral Hernia Repair Laparoscopic ventral hernia repairis a surgery to fix a ventral hernia. Aventral hernia, also called an incisional hernia, is a bulge of body tissue or intestines that pushes through the front part of the abdomen. This can happen if the connective tissue covering the muscles over the abdomen has a weak spot or is torn because of a surgical cut (incision) from a previous surgery. Laparoscopic ventral hernia repair is often done soon after diagnosis to stop the  hernia from getting bigger, becoming uncomfortable, or becoming an emergency. This surgery usually takes about 2 hours, but the time can vary greatly. LET [facility] CARE PROVIDER KNOW ABOUT:  Any allergies you have.  All medicines you are taking, including steroids, vitamins, herbs, eye drops, creams, and over-the-counter medicines.  Previous problems you or members of your family have had with the use of anesthetics.  Any blood disorders you have.  Previous surgeries you have had.  Medical conditions you have. RISKS AND COMPLICATIONS  Generally, laparoscopic ventral hernia repair is a safe procedure. However, as with any surgical procedure, problems can occur. Possible problems include:  Bleeding.  Trouble passing urine or having a bowel movement after the surgery.  Infection.  Pneumonia.  Blood clots.  Pain in the area of the hernia.  A bulge in the area of the hernia that may be caused by a collection of fluid.  Injury to intestines or other structures in the abdomen.  Return of  the hernia after surgery. In some cases, your health care provider may need to stop the laparoscopic procedure and do regular, open surgery. This may be necessary for very difficult hernias, when organs are hard to see, or when bleeding problems occur during surgery. BEFORE THE PROCEDURE   You may need to have blood tests, urine tests, a chest X-ray, or an electrocardiogram done before the day of the surgery.  Ask your health care provider about changing or stopping your regular medicines. This is especially important if you are taking diabetes medicines or blood thinners.  You may need to wash with a special type of germ-killing soap.  Do not eat or drink anything after midnight the night before the procedure or as directed by your health care provider.  Make plans to have someone drive you home after the procedure. PROCEDURE   Small monitors will be put on your body. They are used to  check your heart, blood pressure, and oxygen level.  An IV access tube will be put into a vein in your hand or arm. Fluids and medicine will flow directly into your body through the IV tube.  You will be given medicine that makes you go to sleep (general anesthetic).  Your abdomen will be cleaned with a special soap to kill any germs on your skin.  Once you are asleep, several small incisions will be made in your abdomen.  The large space in your abdomen will be filled with air so that it expands. This gives your health care provider more room and a better view.  A thin, lighted tube with a tiny camera on the end (laparoscope) is put through a small incision in your abdomen. The camera on the laparoscope sends a picture to a TV screen in the operating room. This gives your health care provider a good view inside your abdomen.  Hollow tubes are put through the other small incisions in your abdomen. The tools needed for the procedure are put through these tubes.  Your health care provider puts the tissue or intestines that formed the hernia back in place.  A screen-like patch (mesh) is used to close the hernia. This helps make the area stronger. Stitches, tacks, or staples are used to keep the mesh in place.  Medicine and a bandage (dressing) or skin glue will be put over the incisions. AFTER THE PROCEDURE   You will stay in a recovery area until the anesthetic wears off. Your blood pressure and pulse will be checked often.  You may be able to go home the same day or may need to stay in the hospital for 1-2 days after surgery. Your health care provider will decide when you can go home.  You may feel some pain. You may be given medicine for pain.  You will be urged to do breathing exercises that involve taking deep breaths. This helps prevent a lung infection after a surgery.  You may have to wear compression stockings while you are in the hospital. These stockings help keep blood clots  from forming in your legs.   This information is not intended to replace advice given to you by your health care provider. Make sure you discuss any questions you have with your health care provider.   Document Released: 03/29/2012 Document Revised: 04/17/2013 Document Reviewed: 03/29/2012 Elsevier Interactive Patient Education [employer].

## 2017-06-09 NOTE — Progress Notes (Signed)
Surgical Clinic Progress/Follow-up Note   HPI:  62 y.o. Male presents to clinic for follow-up evaluation of his large and recurrent umbilical hernia. Patient reports it continues to be sore, though he continues to pass flatus and BM, denies constipation, frequent coughing, or straining with urination. He says he continues to wear his abdominal binder, but adds that the binder frequently slips down below the level of his hernia. He also says he has not lost any weight, reiterating the past several months have been a difficult time at which to lose weight. He also says he continues to ambulate and ascend/descend a flight of steps without any CP or SOB.  Review of Systems:  Constitutional: denies any other weight loss, fever, chills, or sweats  Eyes: denies any other vision changes, history of eye injury  ENT: denies sore throat, hearing problems  Respiratory: denies shortness of breath, wheezing  Cardiovascular: denies chest pain, palpitations  Gastrointestinal: abdominal pain, N/V, and bowel function as per HPI Musculoskeletal: denies any other joint pains or cramps  Skin: Denies any other rashes or skin discolorations  Neurological: denies any other headache, dizziness, weakness  Psychiatric: denies any other depression, anxiety  All other review of systems: otherwise negative   Vital Signs:  BP 112/82   Pulse 64   Temp (!) 97 F (36.1 C) (Oral)   Ht 5\' 11"  (1.803 m)   Wt 244 lb (110.7 kg)   BMI 34.03 kg/m    Physical Exam:  Constitutional:  -- Obese body habitus  -- Awake, alert, and oriented x3  Eyes:  -- Pupils equally round and reactive to light  -- No scleral icterus  Ear, nose, throat:  -- No jugular venous distension  -- No nasal drainage, bleeding Pulmonary:  -- No crackles -- Equal breath sounds bilaterally -- Breathing non-labored at rest Cardiovascular:  -- S1, S2 present  -- No pericardial rubs  Gastrointestinal:  -- Soft, nontender, non-distended, no  guarding/rebound  -- Reducible large umbilical hernia, more to the Left of palpable mid-umbilical mesh, likely displaced towards patient's Right side, abdominal binder around patient's waist below level of umbilical hernia -- No other abdominal masses appreciated, pulsatile or otherwise  Musculoskeletal / Integumentary:  -- Wounds or skin discoloration: None appreciated except as described above (GI)  -- Extremities: B/L UE and LE FROM, hands and feet warm  Neurologic:  -- Motor function: intact and symmetric  -- Sensation: intact and symmetric   Laboratory studies:  CBC Latest Ref Rng & Units 10/28/2016 10/02/2016 10/01/2016  WBC 3.8 - 10.6 K/uL 8.6 5.6 7.0  Hemoglobin 13.0 - 18.0 g/dL 14.4 13.2 13.3  Hematocrit 40.0 - 52.0 % 42.9 39.0(L) 39.4(L)  Platelets 150 - 440 K/uL 183 181 189   CMP Latest Ref Rng & Units 10/28/2016 10/01/2016 09/30/2016  Glucose 65 - 99 mg/dL 143(H) 190(H) 124(H)  BUN 6 - 20 mg/dL 17 15 22(H)  Creatinine 0.61 - 1.24 mg/dL 1.21 1.03 1.15  Sodium 135 - 145 mmol/L 137 135 137  Potassium 3.5 - 5.1 mmol/L 4.4 3.7 3.8  Chloride 101 - 111 mmol/L 102 100(L) 102  CO2 22 - 32 mmol/L 28 28 24   Calcium 8.9 - 10.3 mg/dL 9.5 7.8(L) 8.5(L)  Total Protein 6.5 - 8.1 g/dL 6.9 - 6.2(L)  Total Bilirubin 0.3 - 1.2 mg/dL 0.6 - 1.2  Alkaline Phos 38 - 126 U/L 47 - 39  AST 15 - 41 U/L 23 - 17  ALT 17 - 63 U/L 11(L) - 8(L)  Imaging: no new pertinent imaging studies available for review   Assessment:  62 y.o. yo Male with a problem list including...  Patient Active Problem List   Diagnosis Date Noted  . Persistent atrial fibrillation ([place]) 10/18/2016  . Periumbilical hernia   . Atrial fibrillation with rapid ventricular response ([place])   . Umbilical hernia with obstruction but no gangrene 10/06/2016  . Incarcerated hernia 09/29/2016  . Small bowel obstruction ([place])   . Paroxysmal A-fib ([place]) 11/06/2014  . 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 B12  deficiency 11/12/2013  . Hyperlipemia, mixed 10/24/2013  . Hypertension, essential, benign 10/24/2013    presents to clinic for follow-up evaluation of recurrent umbilical hernia 7 months s/p laparoscopic repair of a large umbilical hernia associated at the time of presentation with SBO, complicated most notably by morbid obesity (BMI >34) and otherwise as above.  Plan:   - will check CT abdomen to assess prior mesh repair  - all risks, benefits, and alternatives to laparoscopic mesh repair of recurrent umbilical/ventral abdominal wall hernia were discussed with the patient and his wife, all of their questions were answered to their expressed satisfaction, patient expresses he wishes to proceed, and informed consent was accordingly obtained.  - will plan for re-operative laparoscopic repair of ventral/umbilical hernia with mesh  - weight loss again once more strongly encouraged to reduce risk of recurrence  - anticipate return to clinic 2 weeks after above elective procedure  - instructed to call office if any questions or concerns  All of the above recommendations were discussed with the patient and patient's family, and all of patient's and family's questions were answered to their expressed satisfaction.  -- Marilynne Drivers Rosana Hoes, MD, [place]: [facility] - Partnering for exceptional care. Office: [phone]

## 2017-06-10 ENCOUNTER — Telehealth: Payer: Self-pay | Admitting: Surgery

## 2017-06-10 NOTE — Telephone Encounter (Signed)
Pt advised of pre op date/time and sx date. Sx: 06/29/17 with Dr Davis--laparoscopic ventral hernia repair.  Pre op: 06/22/17 @ 10:30am--office interview.   Patient made aware to call [phone], between 1-3:00pm the day before surgery, to find out what time to arrive.

## 2017-06-21 ENCOUNTER — Ambulatory Visit
Admission: RE | Admit: 2017-06-21 | Discharge: 2017-06-21 | Disposition: A | Discharge status: Home / Self Care | Payer: BLUE CROSS/BLUE SHIELD | Source: Ambulatory Visit | Attending: Surgery | Admitting: Surgery

## 2017-06-21 DIAGNOSIS — K460 Unspecified abdominal hernia with obstruction, without gangrene: Secondary | ICD-10-CM | POA: Diagnosis present

## 2017-06-21 DIAGNOSIS — I700 Atherosclerosis of aorta: Secondary | ICD-10-CM | POA: Diagnosis not present

## 2017-06-21 MED ORDER — IOPAMIDOL (ISOVUE-300) INJECTION 61%
100.0000 mL | Freq: Once | INTRAVENOUS | Status: AC | PRN
  Administered 2017-06-21: 100 mL via INTRAVENOUS

## 2017-06-22 ENCOUNTER — Telehealth: Payer: Self-pay

## 2017-06-22 ENCOUNTER — Encounter
Admission: RE | Admit: 2017-06-22 | Discharge: 2017-06-22 | Disposition: A | Discharge status: Home / Self Care | Payer: BLUE CROSS/BLUE SHIELD | Source: Ambulatory Visit | Attending: Surgery | Admitting: Surgery

## 2017-06-22 ENCOUNTER — Other Ambulatory Visit: Payer: Self-pay

## 2017-06-22 DIAGNOSIS — I4891 Unspecified atrial fibrillation: Secondary | ICD-10-CM | POA: Diagnosis not present

## 2017-06-22 DIAGNOSIS — Z01812 Encounter for preprocedural laboratory examination: Secondary | ICD-10-CM | POA: Diagnosis present

## 2017-06-22 DIAGNOSIS — Z01810 Encounter for preprocedural cardiovascular examination: Secondary | ICD-10-CM | POA: Insufficient documentation

## 2017-06-22 DIAGNOSIS — R9431 Abnormal electrocardiogram [ECG] [EKG]: Secondary | ICD-10-CM | POA: Diagnosis not present

## 2017-06-22 LAB — CBC
HEMATOCRIT: 43.8 % (ref 40.0–52.0)
HEMOGLOBIN: 14.5 g/dL (ref 13.0–18.0)
MCH: 27.8 pg (ref 26.0–34.0)
MCHC: 33.1 g/dL (ref 32.0–36.0)
MCV: 83.8 fL (ref 80.0–100.0)
Platelets: 192 10*3/uL (ref 150–440)
RBC: 5.23 MIL/uL (ref 4.40–5.90)
RDW: 14.4 % (ref 11.5–14.5)
WBC: 8.1 10*3/uL (ref 3.8–10.6)

## 2017-06-22 MED ORDER — CHLORHEXIDINE GLUCONATE CLOTH 2 % EX PADS
6.0000 | MEDICATED_PAD | Freq: Once | CUTANEOUS | Status: DC
  Filled 2017-06-22: qty 6

## 2017-06-22 NOTE — Telephone Encounter (Signed)
Call made to patient at this time. I verbalized to him that Dr. Rosana Hoes wanted to make sure that he stop his Xarelto on Sunday and to not take it on Monday and Tuesday. Patient verbalized that he was made aware and verbalized understanding at this time.

## 2017-06-22 NOTE — Patient Instructions (Signed)
Your procedure is scheduled on: 06/29/17 Wed Report to Same Day Surgery 2nd floor medical mall [facility] Entrance-take elevator on left to 2nd floor.  Check in with surgery information desk.) To find out your arrival time please call [phone] between 1PM - 3PM on 06/28/17 Tues  Remember: Instructions that are not followed completely may result in serious medical risk, up to and including death, or upon the discretion of your surgeon and anesthesiologist your surgery may need to be rescheduled.    _x___ 1. Do not eat food after midnight the night before your procedure. You may drink clear liquids up to 2 hours before you are scheduled to arrive at the hospital for your procedure.  Do not drink clear liquids within 2 hours of your scheduled arrival to the hospital.  Clear liquids include  --Water or Apple juice without pulp  --Clear carbohydrate beverage such as ClearFast or Gatorade  --Black Coffee or Clear Tea (No milk, no creamers, do not add anything to                  the coffee or Tea Type 1 and type 2 diabetics should only drink water.  No gum chewing or hard candies.     __x__ 2. No Alcohol for 24 hours before or after surgery.   __x__3. No Smoking or e-cigarettes for 24 prior to surgery.  Do not use any chewable tobacco products for at least 6 hour prior to surgery   ____  4. Bring all medications with you on the day of surgery if instructed.    __x__ 5. Notify your doctor if there is any change in your medical condition     (cold, fever, infections).    x___6. On the morning of surgery brush your teeth with toothpaste and water.  You may rinse your mouth with mouth wash if you wish.  Do not swallow any toothpaste or mouthwash.   Do not wear jewelry, make-up, hairpins, clips or nail polish.  Do not wear lotions, powders, or perfumes. You may wear deodorant.  Do not shave 48 hours prior to surgery. Men may shave face and neck.  Do not bring valuables to the hospital.     [facility] is not responsible for any belongings or valuables.               Contacts, dentures or bridgework may not be worn into surgery.  Leave your suitcase in the car. After surgery it may be brought to your room.  For patients admitted to the hospital, discharge time is determined by your                       treatment team.  _  Patients discharged the day of surgery will not be allowed to drive home.  You will need someone to drive you home and stay with you the night of your procedure.    Please read over the following fact sheets that you were given:   [facility] Preparing for Surgery and or MRSA Information   _x___ Take anti-hypertensive listed below, cardiac, seizure, asthma,     anti-reflux and psychiatric medicines. These include:  1. metoprolol tartrate (LOPRESSOR) 25 MG tablet  2.  3.  4.  5.  6.  ____Fleets enema or Magnesium Citrate as directed.   _x___ Use CHG Soap or sage wipes as directed on instruction sheet   ____ Use inhalers on the day of surgery and bring to  hospital day of surgery  __x__ Stop Metformin and Janumet 2 days prior to surgery.    ____ Take 1/2 of usual insulin dose the night before surgery and none on the morning     surgery.   _x___ Follow recommendations from Cardiologist, Pulmonologist or PCP regarding          stopping Aspirin, Coumadin, Plavix ,Eliquis, Effient, or Pradaxa, and Pletal. Instructed to stop Xarelto 3 days before surgery by physician  X____Stop Anti-inflammatories such as Advil, Aleve, Ibuprofen, Motrin, Naproxen, Naprosyn, Goodies powders or aspirin products. OK to take Tylenol and                          Celebrex.   _x___ Stop supplements until after surgery.  But may continue Vitamin D, Vitamin B,       and multivitamin.   ____ Bring C-Pap to the hospital.

## 2017-06-23 NOTE — Pre-Procedure Instructions (Signed)
EKG FAXED TO DR Nehemiah Massed TO REVIEW AS INSTRUCTED BY DR P CARROLL. ALSO FAXED FYI TO DR DAVIS. SPOKE WITH April AT DR Nehemiah Massed

## 2017-06-27 NOTE — Pre-Procedure Instructions (Signed)
CLEARED BY DR Nehemiah Massed 06/24/17

## 2017-06-28 MED ORDER — DEXTROSE 5 % IV SOLN
3.0000 g | INTRAVENOUS | Status: AC
  Administered 2017-06-29: 3 g via INTRAVENOUS
  Filled 2017-06-28: qty 3

## 2017-06-29 ENCOUNTER — Ambulatory Visit
Admission: RE | Admit: 2017-06-29 | Discharge: 2017-06-29 | Disposition: A | Discharge status: Home / Self Care | Payer: BLUE CROSS/BLUE SHIELD | Source: Ambulatory Visit | Attending: Surgery | Admitting: Surgery

## 2017-06-29 ENCOUNTER — Encounter: Payer: Self-pay | Admitting: Anesthesiology

## 2017-06-29 ENCOUNTER — Encounter
Admission: RE | Disposition: A | Discharge status: Home / Self Care | Payer: Self-pay | Source: Ambulatory Visit | Attending: Surgery

## 2017-06-29 ENCOUNTER — Ambulatory Visit: Payer: BLUE CROSS/BLUE SHIELD | Admitting: Anesthesiology

## 2017-06-29 DIAGNOSIS — I481 Persistent atrial fibrillation: Secondary | ICD-10-CM | POA: Diagnosis not present

## 2017-06-29 DIAGNOSIS — Z79899 Other long term (current) drug therapy: Secondary | ICD-10-CM | POA: Diagnosis not present

## 2017-06-29 DIAGNOSIS — I10 Essential (primary) hypertension: Secondary | ICD-10-CM | POA: Insufficient documentation

## 2017-06-29 DIAGNOSIS — E538 Deficiency of other specified B group vitamins: Secondary | ICD-10-CM | POA: Diagnosis not present

## 2017-06-29 DIAGNOSIS — E1165 Type 2 diabetes mellitus with hyperglycemia: Secondary | ICD-10-CM | POA: Insufficient documentation

## 2017-06-29 DIAGNOSIS — E782 Mixed hyperlipidemia: Secondary | ICD-10-CM | POA: Insufficient documentation

## 2017-06-29 DIAGNOSIS — K429 Umbilical hernia without obstruction or gangrene: Secondary | ICD-10-CM

## 2017-06-29 DIAGNOSIS — I480 Paroxysmal atrial fibrillation: Secondary | ICD-10-CM | POA: Insufficient documentation

## 2017-06-29 DIAGNOSIS — E1140 Type 2 diabetes mellitus with diabetic neuropathy, unspecified: Secondary | ICD-10-CM | POA: Insufficient documentation

## 2017-06-29 DIAGNOSIS — Z794 Long term (current) use of insulin: Secondary | ICD-10-CM | POA: Insufficient documentation

## 2017-06-29 HISTORY — PX: VENTRAL HERNIA REPAIR: SHX424

## 2017-06-29 LAB — GLUCOSE, CAPILLARY
GLUCOSE-CAPILLARY: 203 mg/dL — AB (ref 65–99)
Glucose-Capillary: 274 mg/dL — ABNORMAL HIGH (ref 65–99)

## 2017-06-29 SURGERY — REPAIR, HERNIA, VENTRAL, LAPAROSCOPIC
Anesthesia: General | Site: Abdomen | Wound class: Clean

## 2017-06-29 MED ORDER — IPRATROPIUM-ALBUTEROL 0.5-2.5 (3) MG/3ML IN SOLN
3.0000 mL | RESPIRATORY_TRACT | Status: DC
  Administered 2017-06-29: 3 mL via RESPIRATORY_TRACT

## 2017-06-29 MED ORDER — FENTANYL CITRATE (PF) 250 MCG/5ML IJ SOLN
INTRAMUSCULAR | Status: AC
  Filled 2017-06-29: qty 5

## 2017-06-29 MED ORDER — SUCCINYLCHOLINE CHLORIDE 20 MG/ML IJ SOLN
INTRAMUSCULAR | Status: AC
  Filled 2017-06-29: qty 1

## 2017-06-29 MED ORDER — FAMOTIDINE 20 MG PO TABS
20.0000 mg | ORAL_TABLET | Freq: Once | ORAL | Status: AC
  Administered 2017-06-29: 20 mg via ORAL

## 2017-06-29 MED ORDER — ROCURONIUM BROMIDE 50 MG/5ML IV SOLN
INTRAVENOUS | Status: AC
Start: 2017-06-29 — End: ?
  Filled 2017-06-29: qty 1

## 2017-06-29 MED ORDER — SUCCINYLCHOLINE CHLORIDE 20 MG/ML IJ SOLN
INTRAMUSCULAR | Status: DC | PRN
  Administered 2017-06-29: 100 mg via INTRAVENOUS

## 2017-06-29 MED ORDER — LIDOCAINE HCL (PF) 1 % IJ SOLN: INTRAMUSCULAR | Status: DC | PRN

## 2017-06-29 MED ORDER — GABAPENTIN 300 MG PO CAPS
300.0000 mg | ORAL_CAPSULE | ORAL | Status: AC
  Administered 2017-06-29: 300 mg via ORAL

## 2017-06-29 MED ORDER — LIDOCAINE HCL 1 % IJ SOLN
INTRAMUSCULAR | Status: DC | PRN
  Administered 2017-06-29: 25 mL via INTRAMUSCULAR

## 2017-06-29 MED ORDER — DEXAMETHASONE SODIUM PHOSPHATE 10 MG/ML IJ SOLN
INTRAMUSCULAR | Status: AC
  Filled 2017-06-29: qty 1

## 2017-06-29 MED ORDER — DEXAMETHASONE SODIUM PHOSPHATE 10 MG/ML IJ SOLN
INTRAMUSCULAR | Status: DC | PRN
  Administered 2017-06-29: 5 mg via INTRAVENOUS

## 2017-06-29 MED ORDER — PHENYLEPHRINE HCL 10 MG/ML IJ SOLN
INTRAMUSCULAR | Status: DC | PRN
  Administered 2017-06-29: 100 ug via INTRAVENOUS
  Administered 2017-06-29 (×4): 200 ug via INTRAVENOUS

## 2017-06-29 MED ORDER — ROCURONIUM BROMIDE 100 MG/10ML IV SOLN
INTRAVENOUS | Status: DC | PRN
  Administered 2017-06-29 (×3): 10 mg via INTRAVENOUS
  Administered 2017-06-29: 5 mg via INTRAVENOUS
  Administered 2017-06-29: 10 mg via INTRAVENOUS
  Administered 2017-06-29: 30 mg via INTRAVENOUS

## 2017-06-29 MED ORDER — MIDAZOLAM HCL 2 MG/2ML IJ SOLN
INTRAMUSCULAR | Status: AC
  Filled 2017-06-29: qty 2

## 2017-06-29 MED ORDER — GABAPENTIN 300 MG PO CAPS
ORAL_CAPSULE | ORAL | Status: AC
  Filled 2017-06-29: qty 1

## 2017-06-29 MED ORDER — PROPOFOL 10 MG/ML IV BOLUS
INTRAVENOUS | Status: AC
  Filled 2017-06-29: qty 20

## 2017-06-29 MED ORDER — GLYCOPYRROLATE 0.2 MG/ML IJ SOLN
INTRAMUSCULAR | Status: DC | PRN
  Administered 2017-06-29: 0.2 mg via INTRAVENOUS

## 2017-06-29 MED ORDER — SODIUM CHLORIDE 0.9 % IV SOLN
INTRAVENOUS | Status: DC
  Administered 2017-06-29 (×2): via INTRAVENOUS

## 2017-06-29 MED ORDER — BUPIVACAINE HCL (PF) 0.5 % IJ SOLN
INTRAMUSCULAR | Status: AC
  Filled 2017-06-29: qty 30

## 2017-06-29 MED ORDER — ACETAMINOPHEN 500 MG PO TABS
ORAL_TABLET | ORAL | Status: AC
  Filled 2017-06-29: qty 2

## 2017-06-29 MED ORDER — BUPIVACAINE HCL (PF) 0.5 % IJ SOLN: INTRAMUSCULAR | Status: DC | PRN

## 2017-06-29 MED ORDER — PROPOFOL 10 MG/ML IV BOLUS
INTRAVENOUS | Status: DC | PRN
  Administered 2017-06-29: 150 mg via INTRAVENOUS

## 2017-06-29 MED ORDER — SUGAMMADEX SODIUM 500 MG/5ML IV SOLN
INTRAVENOUS | Status: AC
  Filled 2017-06-29: qty 5

## 2017-06-29 MED ORDER — PROMETHAZINE HCL 25 MG/ML IJ SOLN: 6.2500 mg | INTRAMUSCULAR | Status: DC | PRN

## 2017-06-29 MED ORDER — ROCURONIUM BROMIDE 50 MG/5ML IV SOLN
INTRAVENOUS | Status: AC
  Filled 2017-06-29: qty 1

## 2017-06-29 MED ORDER — ONDANSETRON HCL 4 MG/2ML IJ SOLN
INTRAMUSCULAR | Status: DC | PRN
  Administered 2017-06-29: 4 mg via INTRAVENOUS

## 2017-06-29 MED ORDER — ONDANSETRON HCL 4 MG/2ML IJ SOLN
INTRAMUSCULAR | Status: AC
  Filled 2017-06-29: qty 2

## 2017-06-29 MED ORDER — LIDOCAINE HCL (PF) 1 % IJ SOLN
INTRAMUSCULAR | Status: AC
  Filled 2017-06-29: qty 30

## 2017-06-29 MED ORDER — LIDOCAINE HCL (CARDIAC) 20 MG/ML IV SOLN
INTRAVENOUS | Status: DC | PRN
  Administered 2017-06-29: 50 mg via INTRAVENOUS

## 2017-06-29 MED ORDER — SUGAMMADEX SODIUM 200 MG/2ML IV SOLN
INTRAVENOUS | Status: DC | PRN
  Administered 2017-06-29: 250 mg via INTRAVENOUS

## 2017-06-29 MED ORDER — LIDOCAINE HCL (PF) 2 % IJ SOLN
INTRAMUSCULAR | Status: AC
  Filled 2017-06-29: qty 10

## 2017-06-29 MED ORDER — METOPROLOL TARTRATE 5 MG/5ML IV SOLN
INTRAVENOUS | Status: AC
  Filled 2017-06-29: qty 5

## 2017-06-29 MED ORDER — GLYCOPYRROLATE 0.2 MG/ML IJ SOLN
INTRAMUSCULAR | Status: AC
  Filled 2017-06-29: qty 1

## 2017-06-29 MED ORDER — FENTANYL CITRATE (PF) 100 MCG/2ML IJ SOLN
INTRAMUSCULAR | Status: AC
  Filled 2017-06-29: qty 2

## 2017-06-29 MED ORDER — FENTANYL CITRATE (PF) 100 MCG/2ML IJ SOLN
INTRAMUSCULAR | Status: DC | PRN
  Administered 2017-06-29 (×4): 50 ug via INTRAVENOUS

## 2017-06-29 MED ORDER — FAMOTIDINE 20 MG PO TABS
ORAL_TABLET | ORAL | Status: AC
  Filled 2017-06-29: qty 1

## 2017-06-29 MED ORDER — ACETAMINOPHEN 500 MG PO TABS
1000.0000 mg | ORAL_TABLET | ORAL | Status: AC
  Administered 2017-06-29: 1000 mg via ORAL

## 2017-06-29 MED ORDER — MIDAZOLAM HCL 2 MG/2ML IJ SOLN
INTRAMUSCULAR | Status: DC | PRN
  Administered 2017-06-29: 2 mg via INTRAVENOUS

## 2017-06-29 MED ORDER — FENTANYL CITRATE (PF) 100 MCG/2ML IJ SOLN: 25.0000 ug | INTRAMUSCULAR | Status: DC | PRN

## 2017-06-29 MED ORDER — IPRATROPIUM-ALBUTEROL 0.5-2.5 (3) MG/3ML IN SOLN
RESPIRATORY_TRACT | Status: AC
  Administered 2017-06-29: 3 mL via RESPIRATORY_TRACT
  Filled 2017-06-29: qty 3

## 2017-06-29 MED ORDER — SODIUM CHLORIDE 0.9 % IV SOLN
INTRAVENOUS | Status: DC | PRN
  Administered 2017-06-29: 50 ug/min via INTRAVENOUS

## 2017-06-29 MED ORDER — METOPROLOL TARTRATE 5 MG/5ML IV SOLN
INTRAVENOUS | Status: DC | PRN
  Administered 2017-06-29 (×2): 2.5 mg via INTRAVENOUS

## 2017-06-29 MED ORDER — BUPIVACAINE-EPINEPHRINE (PF) 0.25% -1:200000 IJ SOLN
INTRAMUSCULAR | Status: AC
  Filled 2017-06-29: qty 30

## 2017-06-29 SURGICAL SUPPLY — 50 items
ADHESIVE MASTISOL STRL (MISCELLANEOUS) ×3 IMPLANT
BINDER ABDOMINAL 12 ML 46-62 (SOFTGOODS) ×3 IMPLANT
BLADE SURG SZ11 CARB STEEL (BLADE) ×3 IMPLANT
CANISTER SUCT 1200ML W/VALVE (MISCELLANEOUS) ×3 IMPLANT
CHLORAPREP W/TINT 26ML (MISCELLANEOUS) ×3 IMPLANT
CLOSURE WOUND 1/2 X4 (GAUZE/BANDAGES/DRESSINGS) ×1
DERMABOND ADVANCED (GAUZE/BANDAGES/DRESSINGS) ×2
DERMABOND ADVANCED .7 DNX12 (GAUZE/BANDAGES/DRESSINGS) ×1 IMPLANT
ELECT REM PT RETURN 9FT ADLT (ELECTROSURGICAL) ×3
ELECTRODE REM PT RTRN 9FT ADLT (ELECTROSURGICAL) ×1 IMPLANT
ETHIBOND 2 0 GREEN CT 2 30IN (SUTURE) ×12 IMPLANT
GLOVE BIO SURGEON STRL SZ7 (GLOVE) ×3 IMPLANT
GLOVE INDICATOR 7.5 STRL GRN (GLOVE) ×3 IMPLANT
GOWN STRL REUS W/ TWL LRG LVL3 (GOWN DISPOSABLE) ×1 IMPLANT
GOWN STRL REUS W/ TWL XL LVL3 (GOWN DISPOSABLE) ×1 IMPLANT
GOWN STRL REUS W/TWL LRG LVL3 (GOWN DISPOSABLE) ×2
GOWN STRL REUS W/TWL XL LVL3 (GOWN DISPOSABLE) ×2
GRASPER SUT TROCAR 14GX15 (MISCELLANEOUS) ×3 IMPLANT
IRRIGATION STRYKERFLOW (MISCELLANEOUS) IMPLANT
IRRIGATOR STRYKERFLOW (MISCELLANEOUS)
IV NS 1000ML (IV SOLUTION) ×2
IV NS 1000ML BAXH (IV SOLUTION) ×1 IMPLANT
KIT TURNOVER KIT A (KITS) ×3 IMPLANT
LABEL OR SOLS (LABEL) ×3 IMPLANT
MESH VENTRALIGHT ST 6X8 (Mesh Specialty) ×2 IMPLANT
MESH VENTRLGHT ELLIPSE 8X6XMFL (Mesh Specialty) ×1 IMPLANT
NEEDLE HYPO 22GX1.5 SAFETY (NEEDLE) ×3 IMPLANT
NEEDLE SPNL 22GX3.5 QUINCKE BK (NEEDLE) ×3 IMPLANT
NEEDLE VERESS 14GA 120MM (NEEDLE) ×3 IMPLANT
PACK LAP CHOLECYSTECTOMY (MISCELLANEOUS) ×3 IMPLANT
SCISSORS METZENBAUM CVD 33 (INSTRUMENTS) IMPLANT
SHEARS HARMONIC ACE PLUS 36CM (ENDOMECHANICALS) IMPLANT
SLEEVE ENDOPATH XCEL 5M (ENDOMECHANICALS) ×3 IMPLANT
SPONGE GAUZE 2X2 8PLY STER LF (GAUZE/BANDAGES/DRESSINGS) ×4
SPONGE GAUZE 2X2 8PLY STRL LF (GAUZE/BANDAGES/DRESSINGS) ×8 IMPLANT
STRIP CLOSURE SKIN 1/2X4 (GAUZE/BANDAGES/DRESSINGS) ×2 IMPLANT
SUT MNCRL 4-0 (SUTURE) ×2
SUT MNCRL 4-0 27XMFL (SUTURE) ×1
SUT SILK 2 0 (SUTURE)
SUT SILK 2 0 SH (SUTURE) ×3 IMPLANT
SUT SILK 2-0 18XBRD TIE 12 (SUTURE) IMPLANT
SUT VIC AB 3-0 SH 27 (SUTURE) ×2
SUT VIC AB 3-0 SH 27X BRD (SUTURE) ×1 IMPLANT
SUT VICRYL 0 AB UR-6 (SUTURE) ×3 IMPLANT
SUTURE MNCRL 4-0 27XMF (SUTURE) ×1 IMPLANT
TACKER 5MM HERNIA 3.5CML NAB (ENDOMECHANICALS) ×3 IMPLANT
TRAY FOLEY W/METER SILVER 16FR (SET/KITS/TRAYS/PACK) ×3 IMPLANT
TROCAR XCEL 12X100 BLDLESS (ENDOMECHANICALS) ×3 IMPLANT
TROCAR XCEL NON-BLD 5MMX100MML (ENDOMECHANICALS) ×3 IMPLANT
TUBING INSUFFLATION (TUBING) ×3 IMPLANT

## 2017-06-29 NOTE — Anesthesia Post-op Follow-up Note (Signed)
Anesthesia QCDR form completed.        

## 2017-06-29 NOTE — Anesthesia Procedure Notes (Signed)
Procedure Name: Intubation Performed by: Rolla Plate, CRNA Pre-anesthesia Checklist: Patient identified, Patient being monitored, Timeout performed, Emergency Drugs available and Suction available Patient Re-evaluated:Patient Re-evaluated prior to induction Oxygen Delivery Method: Circle system utilized Preoxygenation: Pre-oxygenation with 100% oxygen Induction Type: IV induction and Rapid sequence Ventilation: Mask ventilation without difficulty Laryngoscope Size: Miller and 2 Grade View: Grade I Tube type: Oral Tube size: 7.5 mm Number of attempts: 1 Placement Confirmation: ETT inserted through vocal cords under direct vision,  positive ETCO2 and breath sounds checked- equal and bilateral Secured at: 22 cm Tube secured with: Tape Dental Injury: Teeth and Oropharynx as per pre-operative assessment

## 2017-06-29 NOTE — Interval H&P Note (Signed)
History and Physical Interval Note:  06/29/2017 8:55 AM  Reginald Phillips  has presented today for surgery, with the diagnosis of incarcerated hernia  The various methods of treatment have been discussed with the patient and family. After consideration of risks, benefits and other options for treatment, the patient has consented to  Procedure(s): LAPAROSCOPIC VENTRAL HERNIA (N/A) as a surgical intervention .  The patient's history has been reviewed, patient examined, no change in status, stable for surgery.  I have reviewed the patient's chart and labs.  Questions were answered to the patient's satisfaction.     Vickie Epley

## 2017-06-29 NOTE — Transfer of Care (Signed)
Immediate Anesthesia Transfer of Care Note  Patient: Reginald Phillips  Procedure(s) Performed: LAPAROSCOPIC VENTRAL HERNIA (N/A Abdomen)  Patient Location: PACU  Anesthesia Type:General  Level of Consciousness: awake and sedated  Airway & Oxygen Therapy: Patient Spontanous Breathing and Patient connected to face mask oxygen  Post-op Assessment: Report given to RN and Post -op Vital signs reviewed and stable  Post vital signs: Reviewed  Last Vitals:  Vitals:   06/29/17 0835 06/29/17 1246  BP: 140/87 121/79  Pulse: 62 77  Resp:  11  Temp: 36.6 C   SpO2: 100% 100%    Last Pain:  Vitals:   06/29/17 0835  TempSrc: Oral         Complications: No apparent anesthesia complications

## 2017-06-29 NOTE — Anesthesia Postprocedure Evaluation (Signed)
Anesthesia Post Note  Patient: Reginald Phillips  Procedure(s) Performed: LAPAROSCOPIC VENTRAL HERNIA (N/A Abdomen)  Patient location during evaluation: PACU Anesthesia Type: General Level of consciousness: awake and alert Pain management: pain level controlled Vital Signs Assessment: post-procedure vital signs reviewed and stable Respiratory status: spontaneous breathing, nonlabored ventilation, respiratory function stable and patient connected to nasal cannula oxygen Cardiovascular status: blood pressure returned to baseline and stable Postop Assessment: no apparent nausea or vomiting Anesthetic complications: no     Last Vitals:  Vitals:   06/29/17 1334 06/29/17 1410  BP: 137/75 110/61  Pulse: 100 97  Resp: 16   Temp: (!) 35.8 C   SpO2: 96% 96%    Last Pain:  Vitals:   06/29/17 1334  TempSrc: Temporal  PainSc:                  Molli Barrows

## 2017-06-29 NOTE — Anesthesia Preprocedure Evaluation (Signed)
Anesthesia Evaluation  Patient identified by MRN, date of birth, ID band Patient awake    Reviewed: Allergy & Precautions, NPO status , Patient's Chart, lab work & pertinent test results  History of Anesthesia Complications Negative for: history of anesthetic complications  Airway Mallampati: II  TM Distance: >3 FB Neck ROM: Full    Dental  (+) Edentulous Upper, Edentulous Lower   Pulmonary neg pulmonary ROS, neg sleep apnea, neg COPD,    breath sounds clear to auscultation- rhonchi (-) wheezing      Cardiovascular hypertension, Pt. on medications (-) CAD, (-) Past MI and (-) Cardiac Stents + dysrhythmias Atrial Fibrillation  Rhythm:Regular Rate:Normal - Systolic murmurs and - Diastolic murmurs    Neuro/Psych negative neurological ROS  negative psych ROS   GI/Hepatic negative GI ROS, Neg liver ROS,   Endo/Other  diabetes, Insulin Dependent  Renal/GU Renal InsufficiencyRenal disease     Musculoskeletal negative musculoskeletal ROS (+)   Abdominal (+) + obese,   Peds  Hematology negative hematology ROS (+)   Anesthesia Other Findings Past Medical History: 11/12/2013: B12 deficiency No date: Chronic kidney disease     Comment:  50 % funtion from too much metformin No date: Diabetes mellitus without complication (HCC) No date: Dysrhythmia No date: 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: Hyperlipemia, mixed No date: Hypertension 10/24/2013: Hypertension, essential, benign No date: Neuropathy 11/06/2014: Paroxysmal A-fib (HCC) No date: Small bowel obstruction ([place]) 05/01/2014: Uncontrolled type 2 diabetes with neuropathy (HCC)   Reproductive/Obstetrics                             Anesthesia Physical  Anesthesia Plan  ASA: III  Anesthesia Plan: General   Post-op Pain Management:    Induction: Intravenous  PONV Risk Score and Plan: 1 and Ondansetron and Dexamethasone  Airway Management  Planned: Oral ETT  Additional Equipment:   Intra-op Plan:   Post-operative Plan: Extubation in OR  Informed Consent: I have reviewed the patients History and Physical, chart, labs and discussed the procedure including the risks, benefits and alternatives for the proposed anesthesia with the patient or authorized representative who has indicated his/her understanding and acceptance.   Dental advisory given  Plan Discussed with: CRNA and Anesthesiologist  Anesthesia Plan Comments:         Anesthesia Quick Evaluation

## 2017-06-29 NOTE — Op Note (Signed)
SURGICAL OPERATIVE REPORT   DATE OF PROCEDURE: 06/29/2017  ATTENDING Surgeon(s): Vickie Epley, MD   ANESTHESIA: GETA  PRE-OPERATIVE DIAGNOSIS: Recurrent formerly incarcerated 6 cm x 6 cm umbilical hernia, previously the cause of patient's small bowel obstruction  (icd-10's: K42.9)  POST-OPERATIVE DIAGNOSIS: Recurrent formerly incarcerated 6 cm x 6 cm umbilical hernia, previously the cause of patient's small bowel obstruction  (icd-10's: K42.9)  PROCEDURE(S): (cpt: 16109) 1.) Laparoscopic repair of recurrent umbilical hernia with 6 inch x 8 inch Bard Ventralight ST single-side coated mesh for painful recurrent formerly incarcerated umbilical hernia associated with SBO  INTRAOPERATIVE FINDINGS: 6 cm x 6 cm reducible umbilical hernia containing omentum and small intestine on pre-operative CT imaging and large peritoneal hernia sac at the time of laparoscopic surgery (partially reduced by insufflation)  INTRAVENOUS FLUIDS: 1000 mL crystalloid   ESTIMATED BLOOD LOSS: Minimal (<20 mL)  SPECIMENS: No Specimen  IMPLANTS: Bard 6 inches x 8 inches Ventralight ST coated mesh  DRAINS: none  COMPLICATIONS: None apparent  CONDITION AT END OF PROCEDURE: Hemodynamically stable and extubated  DISPOSITION OF PATIENT: PACU  INDICATIONS FOR PROCEDURE:  Patient is a 62 y.o. male who presented to clinic for evaluation of a recurrent umbilical hernia. Patient 8 months ago underwent laparoscopic repair of large umbilical hernia to which his prior bowel obstruction was attributed. Patient says he'd returned to his normal activities without restrictions and was feeling well until 2 months ago he noticed an increasingly painful bulge to the Left of his prior umbilical hernia. When he called our office 2 months ago, he stated that he could not push the area back in, but he has since described it goes back in by itself when he lays down. All risks, benefits, and alternatives to above  procedure were discussed with the patient, patient was encouraged to lose weight, all of patient's questions were answered to his expressed satisfaction, and informed consent was obtained and documented.  DETAILS OF PROCEDURE: Patient was brought to the operating suite and appropriately identified. General anesthesia was administered along with appropriate pre-operative antibiotics, and endotracheal intubation was performed by anesthetist. In supine position, operative site was prepped and draped in the usual sterile fashion, and following a brief time out, local anesthetic was injected subcutaneously over Palmer's point 3 cm inferior to the subcostal margin along the mid-clavicular line, at which point a 5 mm incision was made using a #11 blade scalpel. Fascia was elevated, and a Verress needle was inserted, and its proper position was confirmed using aspiration and saline meniscus test.  Upon insufflation of the abdominal cavity with carbon dioxide to a well-tolerated pressure of 12-15 mmHg, a 5 mm LUQ port followed by a 5 mm 30-degree laparoscope were inserted and used to inspect the abdominal cavity and its contents with no injuries from insertion of the first trochar noted and confirmation of pre-operative diagnosis. Two additional trocars were inserted along the Left side of the abdomen, the middle one being a 12 mm port (through which to insert the hernia repair mesh) and the lower one a second 5 mm port. The hernia contents were then carefully laparoscopically reduced without resection of incarcerated omentum or hernia sac. Spinal needle was then used to percutaneously identify superior, inferior, and two lateral sites which were each marked for placement of lateral anchoring sutures with at least 2 - 3 cm mesh extension beyond the the margins of the hernia defect. Appropriately sized mesh was selected, the inward-facing coated side of the mesh was marked  to designate 4 quadrants, and a 0-0 Ethibond  braided non-absorbable suture was secured at least 1 cm from each of 4 corners to serve as trans-fascial anchoring sutures. The mesh was wet briefly, rolled, and inserted through the 12 mm port into the abdominal cavity, where it was unrolled, re-oriented, and kept with coated side down.  PMI laparoscopic fascial closure device was advanced through 2 mm incisions made at each of 4 markings made to signify the hernia repair mesh edges (at least 1 cm away from Ethibond sutures placed at least 1 cm from the edge of the mesh) and used to externalize the anchoring sutures, which were each tied at one time after all had been externalized and upon confirmation of enough tension on the mesh in a trampoline-like fashion. Laparoscopic tacking device was then used to circumferentially secure the edges of the mesh. One additional 5 mm Right-sided port was inserted under direct laparoscopic visualization to facilitate circumferential tacking. Hemostasis and secure placement of hernia repair mesh were confirmed, and intra-peritoneal cavity was inspected with no additional findings.  PMI laparoscopic fascial closure device was then used to re-approximate fascia at the 12 mm port site. All ports were then removed under direct visualization, and the abdominal cavity was desuflated. All port sites were irrigated/cleaned, additional local anesthetic was injected at each incision, 3-0 Vicryl was used to re-approximate dermis at 12 mm port site, and subcuticular 4-0 Monocryl suture was used to re-approximate skin. Skin was then cleaned, dried, and sterile skin glue was applied. Patient was then safely able to be extubated, awakened, and transferred to PACU for post-operative monitoring and care.  I was present for all aspects of the above procedure, and there were no complications apparent.

## 2017-06-29 NOTE — Discharge Instructions (Signed)
In addition to included general post-operative instructions for Laparoscopic Ventral Hernia Repair,  Diet: Resume home heart healthy diet.   Activity: No heavy lifting >15 pounds (children, pets, laundry, garbage) or strenuous activity x 2 weeks (until follow-up) and no heavy lifting >40 pounds x 6 weeks (total), but light activity and walking are encouraged. Do not drive or drink alcohol if taking narcotic pain medications.  Wound care: 2 days after surgery (Friday, 3/8), may shower/get incision wet with soapy water and pat dry (do not rub incisions), but no baths or submerging incision underwater until follow-up.   Medications: Resume all home medications. For mild to moderate pain: acetaminophen (Tylenol) or ibuprofen/naproxen (if no kidney disease). Combining Tylenol with alcohol can substantially increase your risk of causing liver disease. Narcotic pain medications, if prescribed, can be used for severe pain, though may cause nausea, constipation, and drowsiness. Do not combine Tylenol and Percocet (or similar) within a 6 hour period as Percocet (and similar) contain(s) Tylenol. If you do not need the narcotic pain medication, you do not need to fill the prescription.  Call office [phone]) at any time if any questions, worsening pain, fevers/chills, bleeding, drainage from incision site, or other concerns.

## 2017-07-07 ENCOUNTER — Encounter: Payer: Self-pay | Admitting: Surgery

## 2017-07-14 ENCOUNTER — Encounter: Payer: Self-pay | Admitting: Surgery

## 2017-07-14 ENCOUNTER — Ambulatory Visit (INDEPENDENT_AMBULATORY_CARE_PROVIDER_SITE_OTHER): Payer: BLUE CROSS/BLUE SHIELD | Admitting: Surgery

## 2017-07-14 VITALS — BP 140/82 | HR 68 | Temp 97.4°F | Ht 70.0 in | Wt 239.2 lb

## 2017-07-14 DIAGNOSIS — K429 Umbilical hernia without obstruction or gangrene: Secondary | ICD-10-CM

## 2017-07-14 NOTE — Patient Instructions (Signed)

## 2017-07-14 NOTE — Progress Notes (Signed)
Surgical Clinic Progress/Follow-up Note   HPI:  62 y.o. Male presents to clinic for post-op follow-up evaluation 2 weeks s/p laparoscopic repair of large umbilical hernia to which prior SBO was attributed. Patient reports minimal post-surgical pain and that he has been wearing his binder and avoiding heavy lifting >20 lbs. He otherwise reports +flatus and +BM's WNL, denies N/V, fever/chills, CP, or SOB.  Review of Systems:  Constitutional: denies any other weight loss, fever, chills, or sweats  Eyes: denies any other vision changes, history of eye injury  ENT: denies sore throat, hearing problems  Respiratory: denies shortness of breath, wheezing  Cardiovascular: denies chest pain, palpitations  Gastrointestinal: abdominal pain, N/V, and bowel function as per HPI Musculoskeletal: denies any other joint pains or cramps  Skin: Denies any other rashes or skin discolorations  Neurological: denies any other headache, dizziness, weakness  Psychiatric: denies any other depression, anxiety  All other review of systems: otherwise negative   Vital Signs:  BP 140/82   Pulse 68   Temp (!) 97.4 F (36.3 C) (Oral)   Ht 5\' 10"  (1.778 m)   Wt 239 lb 3.2 oz (108.5 kg)   BMI 34.32 kg/m    Physical Exam:  Constitutional:  -- Obese body habitus  -- Awake, alert, and oriented x3  Eyes:  -- Pupils equally round and reactive to light  -- No scleral icterus  Ear, nose, throat:  -- No jugular venous distension  -- No nasal drainage, bleeding Pulmonary:  -- No crackles -- Equal breath sounds bilaterally -- Breathing non-labored at rest Cardiovascular:  -- S1, S2 present  -- No pericardial rubs  Gastrointestinal:  -- Soft, obese, nontender, non-distended, no guarding/rebound -- Post-surgical abdominal wounds well-approximated without erythema or drainage -- Residual excess skin overlying former supraumbilical hernia with no evidence of recurrence -- No abdominal masses appreciated,  pulsatile or otherwise  Musculoskeletal / Integumentary:  -- Wounds or skin discoloration: None appreciated except as described above (GI)  -- Extremities: B/L UE and LE FROM, hands and feet warm, no edema  Neurologic:  -- Motor function: intact and symmetric  -- Sensation: intact and symmetric   Assessment:  62 y.o. yo Male with a problem list including...  Patient Active Problem List   Diagnosis Date Noted  . Persistent atrial fibrillation ([place]) 10/18/2016  . Periumbilical hernia   . Atrial fibrillation with rapid ventricular response ([place])   . Paroxysmal A-fib ([place]) 11/06/2014  . 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 B12 deficiency 11/12/2013  . Hyperlipemia, mixed 10/24/2013  . Hypertension, essential, benign 10/24/2013    presents to clinic for post-op follow-up evaluation, doing well s/p laparoscopic repair of recurrent large supra-umbilical hernia to which prior SBO was attributed.  Plan:   - continue wearing abdominal binder   - no heavy lifting >40 lbs x total of 6 weeks following surgery  - return to clinic as needed, instructed to call office if any questions or concerns  - long-term weight loss again encouraged and discussed  All of the above recommendations were discussed with the patient and patient's family, and all of patient's and family's questions were answered to their expressed satisfaction.  -- Marilynne Drivers Rosana Hoes, MD, [place]: [facility] - Partnering for exceptional care. Office: [phone]

## 2018-02-19 IMAGING — CR DG ABDOMEN 2V
1 series · 3 of 3 positions shown · non-contrast
Comparison: 09/29/2016.  CT 09/29/2016.

CLINICAL DATA: Small-bowel obstruction.

EXAM:
ABDOMEN - 2 VIEW

[Series 1: dg abd 2 views · 0.14mm/px · 3 of 3 slices shown]
[im 1/3]
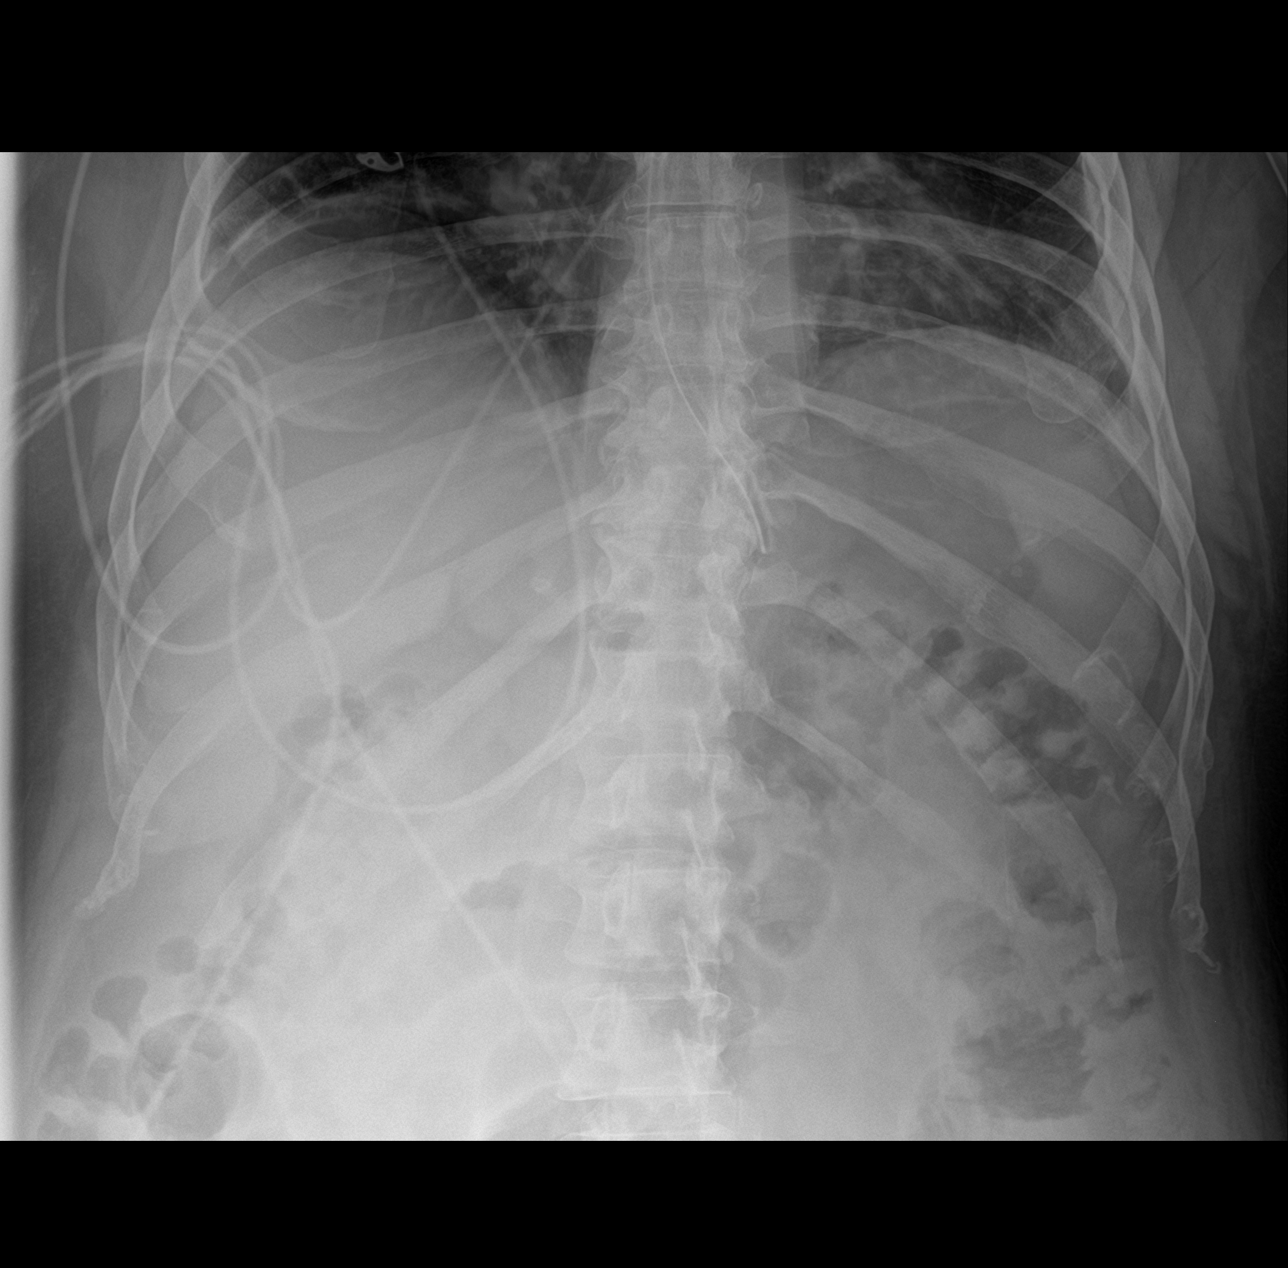
[im 2/3]
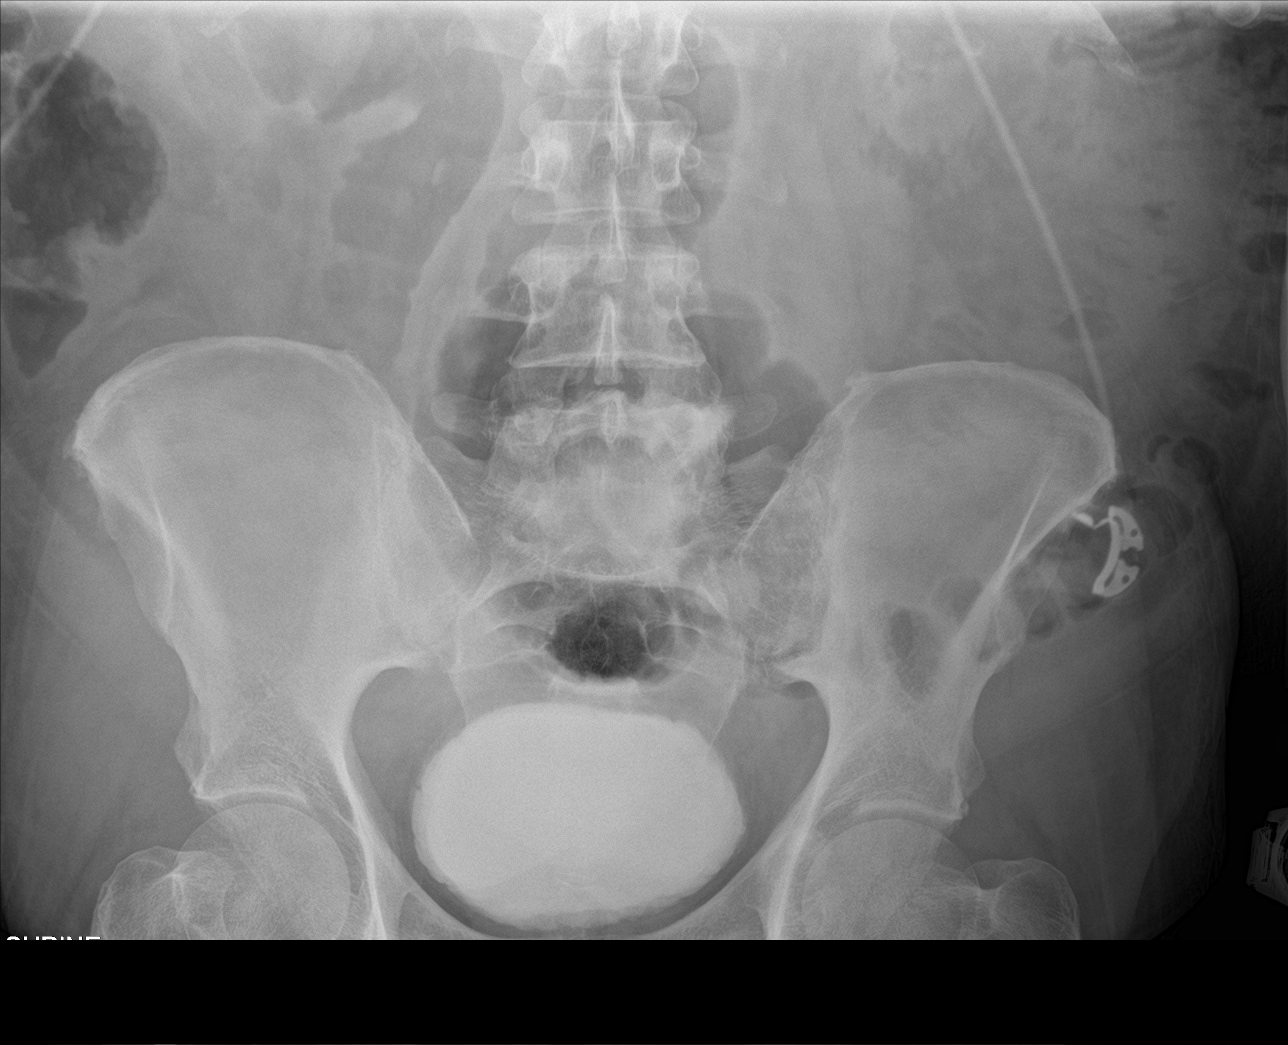
[im 3/3]
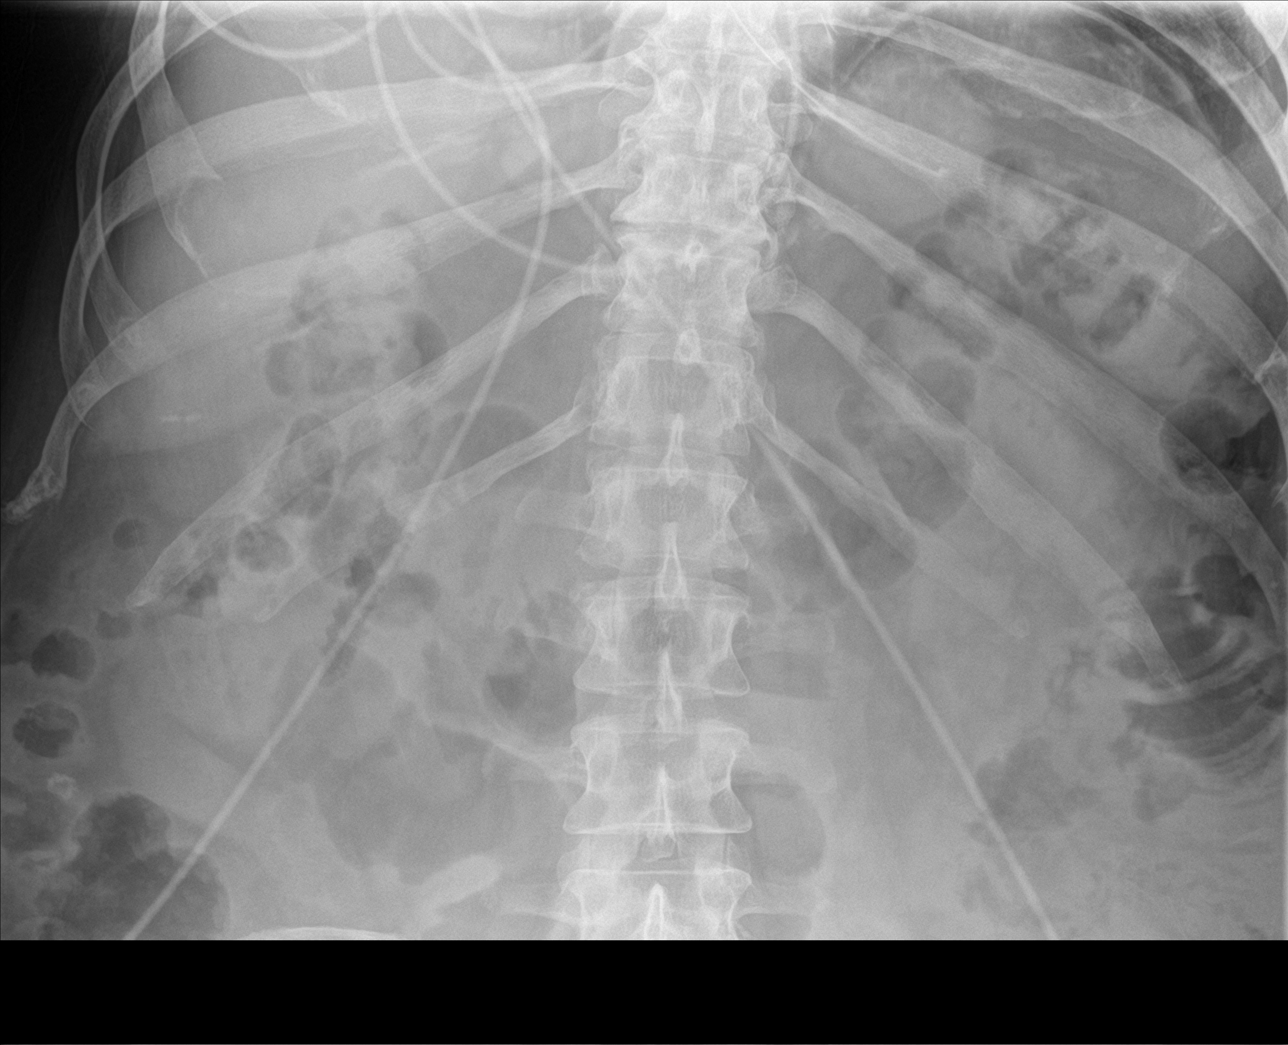

[3 of 3 positions shown; findings below may reference images not displayed]

FINDINGS: NG tube tip is in the proximal portion stomach. Advancement
approximately 8 cm should be considered. Improvement of small bowel
distention. Colonic gas pattern is normal. No free air. Contrast is
in the bladder from recent CT. No acute bony abnormality identified.
IMPRESSION: 1. NG tube tip noted in the proximal stomach. Advancement of
approximately 8 cm should be considered.

2. Interim improvement of small bowel distention. Colonic gas
pattern is normal. No free air .

## 2018-07-13 ENCOUNTER — Telehealth: Payer: Self-pay | Admitting: Urology

## 2018-07-13 NOTE — Telephone Encounter (Signed)
Spoke to patient and gave Dr. Cherrie Gauze recommendation.  Patient is agreeable to the plan.

## 2018-07-13 NOTE — Telephone Encounter (Signed)
Please let this patient know that I have reviewed his chart and that his PSA is only mildly elevated.   In light of wanting to minimize our staff infusions from potential exposures to [place], I recommend that we push out his new patient appointment to 3 months from now and repeat a PSA a few days before his evaluation.    [name], MD

## 2018-07-14 ENCOUNTER — Ambulatory Visit: Payer: BLUE CROSS/BLUE SHIELD | Admitting: Urology

## 2018-10-13 ENCOUNTER — Ambulatory Visit: Payer: BLUE CROSS/BLUE SHIELD | Admitting: Urology

## 2018-11-17 ENCOUNTER — Ambulatory Visit: Payer: BLUE CROSS/BLUE SHIELD | Admitting: Urology

## 2018-12-01 ENCOUNTER — Other Ambulatory Visit: Payer: Self-pay

## 2018-12-01 ENCOUNTER — Other Ambulatory Visit
Admission: RE | Admit: 2018-12-01 | Discharge: 2018-12-01 | Disposition: A | Discharge status: Home / Self Care | Payer: BC Managed Care – PPO | Attending: Urology | Admitting: Urology

## 2018-12-01 ENCOUNTER — Encounter: Payer: Self-pay | Admitting: Urology

## 2018-12-01 ENCOUNTER — Ambulatory Visit (INDEPENDENT_AMBULATORY_CARE_PROVIDER_SITE_OTHER): Payer: BC Managed Care – PPO | Admitting: Urology

## 2018-12-01 VITALS — BP 123/88 | HR 115 | Ht 70.0 in | Wt 255.0 lb

## 2018-12-01 DIAGNOSIS — R972 Elevated prostate specific antigen [PSA]: Secondary | ICD-10-CM | POA: Insufficient documentation

## 2018-12-01 LAB — PSA: Prostatic Specific Antigen: 5.53 ng/mL — ABNORMAL HIGH (ref 0.00–4.00)

## 2018-12-01 NOTE — Progress Notes (Signed)
12/01/2018 9:00 AM   Reginald Phillips 10-16-1955 [id number]  Referring provider: Sofie Hartigan, MD [place],  [place] [address]  Chief Complaint  Patient presents with  . Elevated PSA    New patient    HPI: 63 yo M with elevated PSA who presents today for further evaluation of this.  He underwent routine annual PSA screening with his primary care physician on 06/05/18 which returned at 6.05.  He is had no previous PSA values for review.  He recalls being told he had a slightly elevated PSA in the remote past.  He is not remember what numbers these were.  Today, he denies any significant urinary symptoms.  No frequency or urgency.  No gross hematuria.  No UTIs.  He feels like he empties his bladder.  No history of GU evaluation.  No family history of prostate cancer which he is aware.  No weight loss or bone pain.   PMH: Past Medical History:  Diagnosis Date  . B12 deficiency 11/12/2013  . Chronic kidney disease    50 % funtion from too much metformin  . Diabetes mellitus without complication ([place])   . Dysrhythmia   . Hernia cerebri ([place])   . Hyperlipemia, mixed 10/24/2013  . Hypertension   . Hypertension, essential, benign 10/24/2013  . Neuropathy   . Neuropathy   . Paroxysmal A-fib ([place]) 11/06/2014  . Periumbilical hernia   . Small bowel obstruction ([place])   . Uncontrolled type 2 diabetes with neuropathy ([place]) 05/01/2014    Surgical History: Past Surgical History:  Procedure Laterality Date  . TONSILLECTOMY    . VENTRAL HERNIA REPAIR  11/04/2016   Procedure: LAPAROSCOPIC VENTRAL HERNIA;  Surgeon: Vickie Epley, MD;  Location: [facility] ORS;  Service: Vascular;;  . VENTRAL HERNIA REPAIR N/A 06/29/2017   Procedure: LAPAROSCOPIC VENTRAL HERNIA;  Surgeon: Vickie Epley, MD;  Location: [facility] ORS;  Service: General;  Laterality: N/A;    Home Medications:  Allergies as of 12/01/2018   No Known Allergies     Medication List       Accurate as of December 01, 2018  9:00 AM. If you have any questions, ask your nurse or doctor.        STOP taking these medications   NovoLOG 100 UNIT/ML injection Generic drug: insulin aspart Stopped by: [name], MD     TAKE these medications   acetaminophen 500 MG tablet Commonly known as: TYLENOL Take 1,000 mg by mouth daily as needed for moderate pain or headache.   calcium carbonate 500 MG chewable tablet Commonly known as: TUMS - dosed in mg elemental calcium Chew 2 tablets by mouth daily as needed for indigestion or heartburn.   co-enzyme Q-10 50 MG capsule Take 50 mg by mouth daily.   fenofibrate 160 MG tablet Take 160 mg by mouth every evening.   gabapentin 300 MG capsule Commonly known as: NEURONTIN Take 300 mg by mouth at bedtime as needed (pain).   glimepiride 2 MG tablet Commonly known as: AMARYL   liraglutide 18 MG/3ML Sopn Commonly known as: VICTOZA Inject 1.8 mg into the skin daily.   losartan 50 MG tablet Commonly known as: COZAAR Take 25 mg by mouth daily.   metFORMIN 1000 MG tablet Commonly known as: GLUCOPHAGE Take 1,000 mg by mouth 2 (two) times daily with a meal.   metoprolol succinate 50 MG 24 hr tablet Commonly known as: TOPROL-XL   metoprolol tartrate 25 MG tablet Commonly known  as: LOPRESSOR Take 25 mg by mouth 2 (two) times daily.   Multi-Vitamins Tabs Take 1 tablet by mouth daily.   rivaroxaban 20 MG Tabs tablet Commonly known as: XARELTO Take 20 mg by mouth daily with breakfast.   rosuvastatin 5 MG tablet Commonly known as: CRESTOR Take 5 mg by mouth daily at 6 PM. Takes on Mon, Wed and Fri   Semaglutide (1 MG/DOSE) 2 MG/1.5ML Sopn Inject into the skin.   Tyler Aas FlexTouch 100 UNIT/ML Sopn FlexTouch Pen Generic drug: insulin degludec   vitamin B-12 1000 MCG tablet Commonly known as: CYANOCOBALAMIN Take 1,000 mcg by mouth every evening.       Allergies: No Known Allergies  Family History: Family History  Problem Relation Age of  Onset  . Heart disease Mother   . Heart disease Father   . Heart attack Father     Social History:  reports that he has never smoked. He has never used smokeless tobacco. He reports that he does not drink alcohol or use drugs.  ROS: UROLOGY Frequent Urination?: No Hard to postpone urination?: No Burning/pain with urination?: No Get up at night to urinate?: No Leakage of urine?: No Urine stream starts and stops?: No Trouble starting stream?: No Do you have to strain to urinate?: No Blood in urine?: No Urinary tract infection?: No Sexually transmitted disease?: No Injury to kidneys or bladder?: No Painful intercourse?: No Weak stream?: No Erection problems?: No Penile pain?: No  Gastrointestinal Nausea?: No Vomiting?: No Indigestion/heartburn?: No Diarrhea?: No Constipation?: No  Constitutional Fever: No Night sweats?: No Weight loss?: No Fatigue?: No  Skin Skin rash/lesions?: No Itching?: No  Eyes Blurred vision?: No Double vision?: No  Ears/Nose/Throat Sore throat?: No Sinus problems?: No  Hematologic/Lymphatic Swollen glands?: No Easy bruising?: No  Cardiovascular Leg swelling?: No Chest pain?: No  Respiratory Cough?: No Shortness of breath?: No  Endocrine Excessive thirst?: No  Musculoskeletal Back pain?: No Joint pain?: No  Neurological Headaches?: No Dizziness?: No  Psychologic Depression?: No Anxiety?: No  Physical Exam: BP 123/88   Pulse (!) 115   Ht 5\' 10"  (1.778 m)   Wt 255 lb (115.7 kg)   BMI 36.59 kg/m   Constitutional:  Alert and oriented, No acute distress. HEENT: [place] AT, moist mucus membranes.  Trachea midline, no masses. Cardiovascular: No clubbing, cyanosis, or edema. Respiratory: Normal respiratory effort, no increased work of breathing. GI: Abdomen is soft, nontender, nondistended, no abdominal masses Rectal: Normal sphincter tone.  50 cc prostate, nontender, no nodules. Skin: No rashes, bruises or suspicious  lesions. Neurologic: Grossly intact, no focal deficits, moving all 4 extremities. Psychiatric: Normal mood and affect.  Laboratory Data: Lab Results  Component Value Date   WBC 8.1 06/22/2017   HGB 14.5 06/22/2017   HCT 43.8 06/22/2017   MCV 83.8 06/22/2017   PLT 192 06/22/2017    Lab Results  Component Value Date   CREATININE 1.21 10/28/2016     Assessment & Plan:    1. Elevated PSA  We reviewed the implications of an elevated PSA and the uncertainty surrounding it. In general, a man's PSA increases with age and is produced by both normal and cancerous prostate tissue. Differential for elevated PSA is BPH, prostate cancer, infection, recent intercourse/ejaculation, prostate infarction, recent urethroscopic manipulation (foley placement/cystoscopy) and prostatitis. Management of an elevated PSA can include observation or prostate biopsy and wediscussed this in detail. We discussed that indications for prostate biopsy are defined by age and race specific PSA cutoffs  as well as a PSA velocity of 0.75/year.  Rectal exam today is benign  Given that we only 1 isolated value, recommend a repeat PSA today.  We will call him with this on Monday.  If his PSA is back down, he can continue annual screening until age 38 per AUA guidelines.  If his PSA remains elevated or is more elevated, will make some recommendations which may include close surveillance versus prostate biopsy versus prostate MRI.  He is agreeable this plan. - PSA; Future   [name], MD  [facility] [address] [place],  [address] [phone]

## 2018-12-05 ENCOUNTER — Telehealth: Payer: Self-pay

## 2018-12-05 DIAGNOSIS — R972 Elevated prostate specific antigen [PSA]: Secondary | ICD-10-CM

## 2018-12-05 NOTE — Telephone Encounter (Signed)
-----   Message from [name], MD sent at 12/05/2018  8:49 AM EDT ----- Your PSA has come down slightly to 5.5 but remains elevated.  I recommend that we see each other in 6 months with a PSA prior to your appointment to reassess.  If it remains elevated at that point, we could consider biopsy versus prostate MRI.  [name], MD

## 2018-12-05 NOTE — Telephone Encounter (Signed)
Patient notified and lab order placed, please schedule follow up in 6 months

## 2018-12-05 NOTE — Telephone Encounter (Signed)
Left pt mess to call 

## 2019-06-07 ENCOUNTER — Other Ambulatory Visit: Payer: BC Managed Care – PPO

## 2019-06-13 ENCOUNTER — Ambulatory Visit: Payer: BC Managed Care – PPO | Admitting: Urology

## 2022-02-26 ENCOUNTER — Ambulatory Visit (INDEPENDENT_AMBULATORY_CARE_PROVIDER_SITE_OTHER): Payer: Medicare Other | Admitting: Urology

## 2022-02-26 ENCOUNTER — Encounter: Payer: Self-pay | Admitting: Urology

## 2022-02-26 VITALS — BP 129/81 | HR 81 | Wt 246.6 lb

## 2022-02-26 DIAGNOSIS — R972 Elevated prostate specific antigen [PSA]: Secondary | ICD-10-CM | POA: Diagnosis not present

## 2022-02-26 NOTE — Progress Notes (Signed)
02/26/2022 9:13 AM   Reginald Phillips 10-Jun-1955 [id number]  Referring provider: Sofie Hartigan, [place],  [place] [address]  Chief Complaint  Patient presents with   Elevated PSA    HPI: 66 year old male who presents today for further evaluation of rising PSA.  He is accompanied by his wife today.  He was last seen in 11/2000 for the same issue.  He is post to follow-up in 6 months with a PSA of his been lost to follow-up.  The interim, his PSA continues to rise.   In the interim, his PSA trend as below.  His brother was recently diagnosed with prostate cancer and he had what sounds like radiation.\  Continues to deny urinary symptoms.  PSA trend: 02/23/2022 8.5 08/13/2021  13.3 02/06/2021 7.93 07/29/2020 7.44 06/28/2019 6.17 12/01/2018 5.53 210 2020 6.05  PMH: Past Medical History:  Diagnosis Date   B12 deficiency 11/12/2013   Chronic kidney disease    50 % funtion from too much metformin   Diabetes mellitus without complication ([place])    Dysrhythmia    Hernia cerebri ([place])    Hyperlipemia, mixed 10/24/2013   Hypertension    Hypertension, essential, benign 10/24/2013   Neuropathy    Neuropathy    Paroxysmal A-fib ([place]) 1/82/9937   Periumbilical hernia    Small bowel obstruction ([place])    Uncontrolled type 2 diabetes with neuropathy 05/01/2014    Surgical History: Past Surgical History:  Procedure Laterality Date   TONSILLECTOMY     VENTRAL HERNIA REPAIR  11/04/2016   Procedure: LAPAROSCOPIC VENTRAL HERNIA;  Surgeon: Vickie Epley, MD;  Location: [facility] ORS;  Service: Vascular;;   VENTRAL HERNIA REPAIR N/A 06/29/2017   Procedure: LAPAROSCOPIC VENTRAL HERNIA;  Surgeon: Vickie Epley, MD;  Location: [facility] ORS;  Service: General;  Laterality: N/A;    Home Medications:  Allergies as of 02/26/2022   No Known Allergies      Medication List        Accurate as of February 26, 2022  9:13 AM. If you have any questions, ask your nurse or doctor.           STOP taking these medications    gabapentin 300 MG capsule Commonly known as: NEURONTIN Stopped by: [name], MD   liraglutide 18 MG/3ML Sopn Commonly known as: VICTOZA Stopped by: [name], MD       TAKE these medications    acetaminophen 500 MG tablet Commonly known as: TYLENOL Take 1,000 mg by mouth daily as needed for moderate pain or headache.   calcium carbonate 500 MG chewable tablet Commonly known as: TUMS - dosed in mg elemental calcium Chew 2 tablets by mouth daily as needed for indigestion or heartburn.   co-enzyme Q-10 50 MG capsule Take 50 mg by mouth daily.   cyanocobalamin 1000 MCG tablet Commonly known as: VITAMIN B12 Take 1,000 mcg by mouth every evening.   fenofibrate 160 MG tablet Take 160 mg by mouth every evening.   glimepiride 2 MG tablet Commonly known as: AMARYL   losartan 50 MG tablet Commonly known as: COZAAR Take 25 mg by mouth daily.   metFORMIN 1000 MG tablet Commonly known as: GLUCOPHAGE Take 1,000 mg by mouth 2 (two) times daily with a meal.   metoprolol succinate 50 MG 24 hr tablet Commonly known as: TOPROL-XL   metoprolol tartrate 25 MG tablet Commonly known as: LOPRESSOR Take 25 mg by mouth 2 (two) times daily.   Multi-Vitamins  Tabs Take 1 tablet by mouth daily.   rivaroxaban 20 MG Tabs tablet Commonly known as: XARELTO Take 20 mg by mouth daily with breakfast.   rosuvastatin 5 MG tablet Commonly known as: CRESTOR Take 5 mg by mouth daily at 6 PM. Takes on Mon, Wed and Fri   Semaglutide (1 MG/DOSE) 2 MG/1.5ML Sopn Inject into the skin.   Tyler Aas FlexTouch 100 UNIT/ML FlexTouch Pen Generic drug: insulin degludec        Allergies: No Known Allergies  Family History: Family History  Problem Relation Age of Onset   Heart disease Mother    Heart disease Father    Heart attack Father     Social History:  reports that he has never smoked. He has never used smokeless tobacco. He reports  that he does not drink alcohol and does not use drugs.   Physical Exam: BP 129/81   Pulse 81   Wt 246 lb 9.6 oz (111.9 kg)   BMI 35.38 kg/m   Constitutional:  Alert and oriented, No acute distress. HEENT: [place] AT, moist mucus membranes.  Trachea midline, no masses. Cardiovascular: No clubbing, cyanosis, or edema. Respiratory: Normal respiratory effort, no increased work of breathing. Rectal: Normal sphincter tone.  Enlarged prostate, 50+ cc with induration at the left lateral ridge and a small nodule here although this nodule is not firm Skin: No rashes, bruises or suspicious lesions. Neurologic: Grossly intact, no focal deficits, moving all 4 extremities. Psychiatric: Normal mood and affect.  Laboratory Data: Lab Results  Component Value Date   WBC 8.1 06/22/2017   HGB 14.5 06/22/2017   HCT 43.8 06/22/2017   MCV 83.8 06/22/2017   PLT 192 06/22/2017    Lab Results  Component Value Date   CREATININE 1.21 10/28/2016     Assessment & Plan:    1. Elevated PSA PSA is elevated and continues to rise although there is some degree of fluctuation  He does have significant possible medically which may be contributing factor  At this point, given the equivocal prostate exam and PSA fluctuation, I recommended prostate MRI for further restratification.  Depending these results, we may or may not recommend prostate biopsy.  He is agreeable with this plan.  He will return to discuss these results.  In the past, he was lost to follow-up, discussed the importance of follow-up as scheduled. - MR PROSTATE W WO CONTRAST; Future   Return in about 6 weeks (around 04/09/2022) for MR prostate.  [name], MD  [facility] [address], [place], [place] [address] [phone]

## 2022-04-02 ENCOUNTER — Ambulatory Visit
Admission: RE | Admit: 2022-04-02 | Discharge: 2022-04-02 | Disposition: A | Discharge status: Home / Self Care | Payer: Medicare Other | Source: Ambulatory Visit | Attending: Urology | Admitting: Urology

## 2022-04-02 DIAGNOSIS — R972 Elevated prostate specific antigen [PSA]: Secondary | ICD-10-CM | POA: Diagnosis present

## 2022-04-02 MED ORDER — GADOBUTROL 1 MMOL/ML IV SOLN
10.0000 mL | Freq: Once | INTRAVENOUS | Status: AC | PRN
  Administered 2022-04-02: 10 mL via INTRAVENOUS

## 2022-04-09 ENCOUNTER — Ambulatory Visit (INDEPENDENT_AMBULATORY_CARE_PROVIDER_SITE_OTHER): Payer: Medicare Other | Admitting: Urology

## 2022-04-09 ENCOUNTER — Encounter: Payer: Self-pay | Admitting: Urology

## 2022-04-09 VITALS — BP 133/60 | HR 81 | Ht 70.5 in | Wt 243.0 lb

## 2022-04-09 DIAGNOSIS — R6889 Other general symptoms and signs: Secondary | ICD-10-CM | POA: Diagnosis not present

## 2022-04-09 DIAGNOSIS — R972 Elevated prostate specific antigen [PSA]: Secondary | ICD-10-CM | POA: Diagnosis not present

## 2022-04-09 NOTE — Patient Instructions (Signed)

## 2022-04-09 NOTE — Progress Notes (Signed)
I, Jeanmarie Hubert Maxie,acting as a scribe for [name], MD.,have documented all relevant documentation on the behalf of [name], MD,as directed by  [name], MD while in the presence of [name], MD.   04/09/22 9:41 AM   Reginald Phillips 08-04-1955 [id number]  Referring provider: Sofie Hartigan, MD [place],  [place] [address]  Chief Complaint  Patient presents with   Elevated PSA         HPI: 66 year-old male with a personal history of elevated PSA.   He had an abnormal rectal exam in duration on the left lateral wall. He has a family history of prostate cancer.   He most recently underwent a prostate MRI that shows two separate PI-RADS lesions in the peripheral zone left and right. His prostate volume was 74 grams, no evidence of extracapsular extension.   He is on Xarelto for history of Afib.   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 If you have any questions, ask your nurse or doctor.          STOP taking these medications    Semaglutide (1 MG/DOSE) 2 MG/1.5ML Sopn Stopped by: [name], MD       TAKE these medications    acetaminophen 500 MG tablet Commonly known as: TYLENOL Take 1,000 mg by mouth daily as needed for moderate pain or headache.   calcium carbonate 500 MG chewable tablet Commonly known as: TUMS - dosed in mg elemental calcium Chew 2 tablets by mouth daily as needed for indigestion or heartburn.   co-enzyme Q-10 50 MG capsule Take 50 mg by mouth daily.   cyanocobalamin 1000 MCG tablet Commonly known as: VITAMIN B12 Take 1,000 mcg by mouth every evening.   fenofibrate 160 MG tablet Take 160 mg by mouth every evening.   glimepiride 2 MG tablet Commonly known as: AMARYL   losartan 50 MG tablet Commonly known as: COZAAR Take 25 mg by mouth daily.   metFORMIN 1000 MG tablet Commonly known as: GLUCOPHAGE Take 1,000 mg by mouth 2 (two) times daily with a meal.   metoprolol succinate 50 MG 24 hr tablet Commonly known as: TOPROL-XL   metoprolol tartrate 25 MG tablet Commonly known  as: LOPRESSOR Take 25 mg by mouth 2 (two) times daily.   Mounjaro 2.5 MG/0.5ML Pen Generic drug: tirzepatide Inject 2.5 mg into the skin once a week.   Multi-Vitamins Tabs Take 1 tablet by mouth daily.   rivaroxaban 20 MG Tabs tablet Commonly known as: XARELTO Take 20 mg by mouth daily with breakfast.   rosuvastatin 5 MG tablet Commonly known as: CRESTOR Take 5 mg by mouth daily at 6 PM. Takes on Mon, Wed and Fri   Tresiba FlexTouch 100 UNIT/ML FlexTouch Pen Generic drug: insulin degludec        Family History: Family History  Problem Relation Age of Onset   Heart disease Mother    Heart disease Father    Heart attack Father     Social History:   reports that he has never smoked. He has never used smokeless tobacco. He reports that he does not drink alcohol and does not use drugs.   Physical Exam: BP 133/60 (BP Location: Left Arm, Patient Position: Sitting, Cuff Size: Large)   Pulse 81   Ht 5' 10.5" (1.791 m)   Wt 243 lb (110.2 kg)   BMI 34.37 kg/m   Constitutional:  Alert and oriented, No acute distress. HEENT: [place] AT, moist mucus membranes.  Trachea midline, no masses. Neurologic: Grossly intact, no focal deficits, moving all 4 extremities. Psychiatric: Normal mood and affect.  Laboratory Data: Lab Results  Component Value Date   WBC 8.1 06/22/2017   HGB 14.5 06/22/2017   HCT 43.8 06/22/2017   MCV 83.8 06/22/2017   PLT 192 06/22/2017    Lab Results  Component Value Date   CREATININE 1.21 10/28/2016    Urinalysis    Component Value Date/Time   COLORURINE AMBER (A) 09/29/2016 1632   APPEARANCEUR HAZY (A) 09/29/2016 1632   LABSPEC 1.036 (H) 09/29/2016 1632   PHURINE 5.0 09/29/2016 1632   GLUCOSEU NEGATIVE 09/29/2016 1632   HGBUR NEGATIVE 09/29/2016 1632   BILIRUBINUR SMALL (A) 09/29/2016 1632   KETONESUR 5 (A) 09/29/2016 1632   PROTEINUR 100 (A) 09/29/2016 1632   NITRITE NEGATIVE 09/29/2016 1632   LEUKOCYTESUR NEGATIVE 09/29/2016 1632    Lab Results  Component Value Date   BACTERIA NONE SEEN 09/29/2016    Pertinent Imaging: EXAM: MR PROSTATE WITHOUT AND WITH CONTRAST   TECHNIQUE: Multiplanar multisequence MRI images were obtained of the pelvis centered about the prostate. Pre and post contrast images were obtained.   CONTRAST:  45m GADAVIST GADOBUTROL 1 MMOL/ML IV SOLN   COMPARISON:  CT pelvis 06/21/2017   FINDINGS: Prostate: Region of interest # 1: PI-RADS category 4 lesion of the right posterolateral peripheral zone at the apex, with focally reduced T2 signal (image 58, series 11) corresponding with focally reduced ADC map activity (image 19, series 9) and mild focal early enhancement  (image 186, series 13). This measures 0.24 cc (1.0 by 0.5 by 0.6 cm).   Region of interest # 2: PI-RADS category 4 lesion of the left posteromedial, posterolateral, and anterior peripheral zone at the apex, with mild anterior zone involvement in the mid gland, demonstrating focally reduced T2 signal (image 64, series 11) with associated reduced ADC map activity (image 21, series 9) and focal early enhancement (image 160, series 13). This measures 1.18 cc (2.2 by 0.7 by 1.5 cm).   Volume: 3D volumetric analysis: Prostate volume 74.63 cc (5.9 by 4.7 by 6.2 cm).   Transcapsular spread:  Absent   Seminal vesicle involvement: Absent   Neurovascular bundle involvement: Absent  Pelvic adenopathy: Absent   Bone metastasis: Absent   Other findings: Anterior pelvic hernia mesh. There is a tear of the right adductor aponeurosis at the pubis ("sports hernia").   IMPRESSION: 1. Two PI-RADS category 4 lesions are present in the peripheral zone. Targeting data sent to [facility]. 2. Prostatomegaly and benign prostate hypertrophy. 3. Anterior pelvic hernia mesh with tear of the right adductor aponeurosis at the pubis.     Electronically Signed   By: Van Clines M.D.   On: 04/03/2022 09:52  Personally reviewed and agrees with the interpretation.   Assessment & Plan:    Abnormal rectal exam/Elevated PSA MRI suspicious PI-RADS 4 bilaterally. He was offered in office biopsy vs fusion biopsy, he prefers to have it done in [place] with cognitive biopsy. He is on Xarelto so he will need cardiac clearance.   We discussed prostate biopsy in detail including the procedure itself, the risks of blood in the urine, stool, and ejaculate, serious infection, and discomfort. He is willing to proceed with this as discussed.   Return for after prostate biopsy.   [facility] [address] [place], [place] [address] [phone]

## 2022-04-28 ENCOUNTER — Ambulatory Visit (INDEPENDENT_AMBULATORY_CARE_PROVIDER_SITE_OTHER): Payer: Medicare Other | Admitting: Urology

## 2022-04-28 DIAGNOSIS — C61 Malignant neoplasm of prostate: Secondary | ICD-10-CM | POA: Diagnosis not present

## 2022-04-28 DIAGNOSIS — R972 Elevated prostate specific antigen [PSA]: Secondary | ICD-10-CM | POA: Diagnosis not present

## 2022-04-28 MED ORDER — GENTAMICIN SULFATE 40 MG/ML IJ SOLN
80.0000 mg | Freq: Once | INTRAMUSCULAR | Status: AC
  Administered 2022-04-28: 80 mg via INTRAMUSCULAR

## 2022-04-28 MED ORDER — LEVOFLOXACIN 500 MG PO TABS
500.0000 mg | ORAL_TABLET | Freq: Once | ORAL | Status: AC
  Administered 2022-04-28: 500 mg via ORAL

## 2022-04-28 MED ORDER — GENTAMICIN SULFATE 40 MG/ML IJ SOLN: 40.0000 mg | Freq: Once | INTRAMUSCULAR | Status: DC

## 2022-04-28 NOTE — Progress Notes (Signed)
   04/28/22  CC:  Chief Complaint  Patient presents with   Elevated PSA    HPI: 67 yo M who presents today for prostate biopsy, see previous notes for details.    NED. A&Ox3.   No respiratory distress   Abd soft, NT, ND Normal sphincter tone  Prostate Biopsy Procedure   Informed consent was obtained after discussing risks/benefits of the procedure.  A time out was performed to ensure correct patient identity.  Pre-Procedure: - Last PSA Level: No results found for: "PSA" - Gentamicin given prophylactically - Levaquin 500 mg administered PO -Transrectal Ultrasound performed revealing a 61.3 gm prostate -No significant hypoechoic or median lobe noted  Procedure: - Prostate block performed using 10 cc 1% lidocaine and biopsies taken from sextant areas, a total of 12 under ultrasound guidance.  Post-Procedure: - Patient tolerated the procedure well - He was counseled to seek immediate medical attention if experiences any severe pain, significant bleeding, or fevers - Return in one week to discuss biopsy results  [name], MD    [name], MD

## 2022-04-29 LAB — SURGICAL PATHOLOGY

## 2022-05-07 ENCOUNTER — Encounter: Payer: Self-pay | Admitting: Urology

## 2022-05-07 ENCOUNTER — Ambulatory Visit (INDEPENDENT_AMBULATORY_CARE_PROVIDER_SITE_OTHER): Payer: Medicare Other | Admitting: Urology

## 2022-05-07 VITALS — BP 135/74 | HR 99 | Ht 70.5 in | Wt 243.0 lb

## 2022-05-07 DIAGNOSIS — C61 Malignant neoplasm of prostate: Secondary | ICD-10-CM | POA: Diagnosis not present

## 2022-05-07 DIAGNOSIS — N5203 Combined arterial insufficiency and corporo-venous occlusive erectile dysfunction: Secondary | ICD-10-CM

## 2022-05-07 MED ORDER — SILDENAFIL CITRATE 20 MG PO TABS: 20.0000 mg | ORAL_TABLET | ORAL | 11 refills | Status: DC | PRN

## 2022-05-07 NOTE — Progress Notes (Signed)
I, Reginald Phillips,acting as a scribe for [name], MD.,have documented all relevant documentation on the behalf of [name], MD,as directed by  [name], MD while in the presence of [name], MD.   I, [name] as a scribe for [name], MD.,have documented all relevant documentation on the behalf of [name], MD,as directed by  [name], MD while in the presence of [name], MD.   05/07/22 2:36 PM   Reginald Phillips 08/01/1955 [id number]  Referring provider: Sofie Hartigan, MD [place],  [place] [address]  Chief Complaint  Patient presents with   Results    HPI: 67 year-old male who presents today to discuss his pathology report.   He underwent a prostate biopsy on 04/28/22 for further evaluation of elevated PSA. His TRUS volume was 61.3 grams. He had an abnormal rectal exam with induration on the left wall of the prostate as well as a family history of prostate cancer. He had two separate PI-RADS 4 lesions in the peripheral zone bilaterally. On biopsy, he had one core of Gleason 3+3 prostate cancer, otherwise no pathology.   Today, he complains that he has decreased quality of his erections.   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 If you have any questions, ask your nurse or doctor.          acetaminophen 500 MG tablet Commonly known as: TYLENOL Take 1,000 mg by mouth daily as needed for moderate pain or headache.   calcium carbonate 500 MG chewable tablet Commonly known as: TUMS - dosed in mg elemental calcium Chew 2 tablets by mouth daily as needed for indigestion or heartburn.   co-enzyme Q-10 50 MG capsule Take 50 mg by mouth daily.   cyanocobalamin 1000 MCG tablet Commonly known as: VITAMIN B12 Take 1,000 mcg by mouth every evening.   fenofibrate 160 MG tablet Take 160 mg by mouth every evening.   glimepiride 2 MG tablet Commonly known as: AMARYL   glipiZIDE 5 MG tablet Commonly known as: GLUCOTROL Take 5 mg by mouth daily.   losartan 50 MG tablet Commonly known as: COZAAR Take 25 mg by mouth daily.   metFORMIN 1000 MG tablet Commonly known as: GLUCOPHAGE Take 1,000 mg by mouth 2 (two) times daily with a meal.   metoprolol  succinate 50 MG 24 hr tablet Commonly known as: TOPROL-XL   metoprolol tartrate 25 MG tablet Commonly known as: LOPRESSOR Take 25 mg by mouth 2 (two) times daily.   Mounjaro 5 MG/0.5ML Pen Generic drug: tirzepatide Inject 5 mg into the skin once a week. What changed: Another medication with the same name was removed. Continue taking this medication, and follow the directions you see here. Changed by: [name], MD   Multi-Vitamins Tabs Take 1 tablet by mouth daily.   rivaroxaban 20 MG Tabs tablet Commonly known as: XARELTO Take 20 mg by mouth daily with breakfast.   rosuvastatin 5 MG tablet Commonly known as:  CRESTOR Take 5 mg by mouth daily at 6 PM. Takes on Mon, Wed and Fri   sildenafil 20 MG tablet Commonly known as: Revatio Take 1 tablet (20 mg total) by mouth as needed. Take 1-5 tabs as needed prior to intercourse Started by: [name], MD   Tyler Aas FlexTouch 100 UNIT/ML FlexTouch Pen Generic drug: insulin degludec        Family History: Family History  Problem Relation Age of Onset   Heart disease Mother    Heart disease Father    Heart attack Father     Social History:  reports that he has never smoked. He has never used smokeless tobacco. He reports that he does not drink alcohol and does not use drugs.   Physical Exam: BP 135/74 (BP Location: Left Arm, Patient Position: Sitting, Cuff Size: Large)   Pulse 99   Ht 5' 10.5" (1.791 m)   Wt 243 lb (110.2 kg)   BMI 34.37 kg/m   Constitutional:  Alert and oriented, No acute distress. HEENT: [place] AT, moist mucus membranes.  Trachea midline, no masses. Neurologic: Grossly intact, no focal deficits, moving all 4 extremities. Psychiatric: Normal mood and affect.  Assessment & Plan:    1. Prostate cancer  - Newly diagnosed, low risk prostate cancer. Most strongly reccommend active surveillance based on AUA and NCCN guidelines. Would reccommend another PSA in six months. At a year consideration to either repeat MRI versus confirmatory biopsy with another PSA as well. We also recommend rectal exam annually.   The patient was counseled about the natural history of prostate cancer and the standard treatment options that are available for prostate cancer. It was explained to him how his age and life expectancy, clinical stage, Gleason score, and PSA affect his prognosis, the decision to proceed with additional staging studies, as well as how that information influences recommended treatment strategies. We discussed the roles for active surveillance, radiation therapy, surgical therapy, androgen deprivation, as well as ablative  therapy options for the treatment of prostate cancer as appropriate to his individual cancer situation. We discussed the risks and benefits of these options with regard to their impact on cancer control and also in terms of potential adverse events, complications, and impact on quality of life particularly related to urinary, bowel, and sexual function. The patient was encouraged to ask questions throughout the discussion today and all questions were answered to his stated satisfaction. In addition, the patient was provided with and/or directed to appropriate resources and literature for further education about prostate cancer treatment options.  2. Erectile Dysfunction - Sildenafil 20 mg, up to 100 mg as needed.  Return in about 6 months (around 11/05/2022) for PSA.   [facility] [address] [place], [place] [address] [phone]

## 2022-05-10 ENCOUNTER — Encounter: Payer: Self-pay | Admitting: Emergency Medicine

## 2022-05-10 ENCOUNTER — Ambulatory Visit
Admission: EM | Admit: 2022-05-10 | Discharge: 2022-05-10 | Disposition: A | Discharge status: Home / Self Care | Payer: Medicare Other | Attending: Physician Assistant | Admitting: Physician Assistant

## 2022-05-10 DIAGNOSIS — R300 Dysuria: Secondary | ICD-10-CM | POA: Diagnosis not present

## 2022-05-10 DIAGNOSIS — N3001 Acute cystitis with hematuria: Secondary | ICD-10-CM

## 2022-05-10 LAB — URINALYSIS, ROUTINE W REFLEX MICROSCOPIC
Glucose, UA: >1000 mg/dL — AB
Nitrite: POSITIVE — AB
Protein, ur: 100 mg/dL — AB
Specific Gravity, Urine: 1.025 (ref 1.005–1.030)
pH: 5.5 (ref 5.0–8.0)

## 2022-05-10 LAB — URINALYSIS, MICROSCOPIC (REFLEX): WBC, UA: >50 WBC/hpf (ref 0–5)

## 2022-05-10 MED ORDER — PHENAZOPYRIDINE HCL 200 MG PO TABS: 200.0000 mg | ORAL_TABLET | Freq: Three times a day (TID) | ORAL | 0 refills | Status: AC

## 2022-05-10 MED ORDER — SULFAMETHOXAZOLE-TRIMETHOPRIM 800-160 MG PO TABS: 1.0000 | ORAL_TABLET | Freq: Two times a day (BID) | ORAL | 0 refills | Status: AC

## 2022-05-10 NOTE — ED Provider Notes (Signed)
[facility]    CSN: [id number] Arrival date & time: 05/10/22  1017      History   Chief Complaint Chief Complaint  Patient presents with   Dysuria   Hematuria    HPI Reginald Phillips is a 67 y.o. male presenting for approximate 3-day history of urinary frequency, urgency, and 1 episode of hematuria.  Patient has a recent diagnosis of prostate cancer.  Reports he had a prostate biopsy 2 weeks ago.  Followed up with urology 3 days ago.  He is not reporting any fevers, flank pain, pelvic pain, testicular pain, or urethral discharge.  No history of UTIs.  Medical history significant for diabetes, atrial fibrillation, hypertension, hyperlipidemia.  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/2014    Patient Active Problem List   Diagnosis Date Noted   Persistent atrial fibrillation ([place]) 10/18/2016   Recurrent umbilical hernia    Atrial fibrillation with rapid ventricular response ([place])    Paroxysmal A-fib ([place]) 11/06/2014   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 Authorizing Provider  acetaminophen (TYLENOL) 500 MG tablet Take 1,000 mg by mouth daily as needed for moderate pain or headache.   Yes [provider]  calcium carbonate (TUMS - DOSED IN MG ELEMENTAL CALCIUM) 500 MG chewable tablet Chew 2 tablets by mouth daily as needed for indigestion or heartburn.   Yes [provider]  co-enzyme Q-10 50 MG capsule Take 50 mg by mouth daily.   Yes [provider]  fenofibrate 160 MG tablet Take 160 mg by mouth every evening.  08/31/16  Yes [provider]  glimepiride (AMARYL) 2 MG tablet  11/06/18  Yes [provider]  glipiZIDE (GLUCOTROL) 5 MG tablet Take 5 mg by mouth daily. 05/05/22  Yes [provider]  losartan (COZAAR) 50 MG tablet Take 25 mg by mouth daily.   Yes [provider]  metFORMIN (GLUCOPHAGE) 1000 MG tablet Take 1,000 mg by mouth 2 (two) times daily with a meal.   Yes [provider]  metoprolol succinate (TOPROL-XL) 50 MG 24 hr tablet  10/02/18  Yes [provider]  MOUNJARO 5 MG/0.5ML Pen Inject 5 mg into the skin once a week. 05/02/22  Yes [provider]  Multiple Vitamin (MULTI-VITAMINS) TABS Take 1 tablet by mouth daily.    Yes [provider]  phenazopyridine (PYRIDIUM) 200 MG tablet Take 1 tablet (200 mg total) by mouth 3 (three) times daily. 05/10/22  Yes Danton Clap, PA-C  rivaroxaban (XARELTO) 20 MG TABS tablet Take 20 mg by mouth daily with breakfast.    Yes [provider]  rosuvastatin (CRESTOR) 5 MG tablet Take 5 mg by mouth daily at 6 PM. Takes on Mon, Wed and Fri   Yes [provider]  sulfamethoxazole-trimethoprim (BACTRIM DS) 800-160 MG tablet Take 1 tablet by mouth 2 (two) times daily for 7 days. 05/10/22 05/17/22 Yes Danton Clap, PA-C  TRESIBA FLEXTOUCH 100  UNIT/ML SOPN FlexTouch Pen  11/24/18  Yes [provider]  vitamin B-12 (CYANOCOBALAMIN) 1000 MCG tablet Take 1,000 mcg by mouth every evening.    Yes [provider]  metoprolol tartrate (LOPRESSOR) 25 MG tablet Take 25 mg by mouth 2 (two) times daily.    [provider]  sildenafil (REVATIO) 20 MG tablet Take 1 tablet (20 mg total) by mouth as needed. Take 1-5 tabs as needed prior to intercourse 05/07/22   [name], MD    Family History Family History  Problem Relation Age of Onset   Heart disease Mother    Heart disease Father    Heart attack Father     Social History Social History   Tobacco Use   Smoking status: Never   Smokeless tobacco: Never  Vaping Use   Vaping Use: Never used  Substance Use Topics   Alcohol use: No   Drug use: No     Allergies   Patient has no known allergies.   Review of Systems Review of Systems  Constitutional:  Negative for fatigue and fever.  Gastrointestinal:  Negative for abdominal pain, nausea and vomiting.  Genitourinary:  Positive for dysuria, frequency, hematuria and urgency. Negative for genital sores, penile discharge, penile pain, penile swelling, scrotal swelling and testicular pain.  Musculoskeletal:  Negative for arthralgias.  Skin:  Negative for rash.  Neurological:  Negative for weakness.     Physical Exam Triage Vital Signs ED Triage Vitals  Enc Vitals Group     BP 05/10/22 1127 (!) 146/78     Pulse Rate 05/10/22 1127 98     Resp 05/10/22 1127 18     Temp 05/10/22 1127 98.2 F (36.8 C)     Temp Source 05/10/22 1127 Oral     SpO2 05/10/22 1127 95 %     Weight 05/10/22 1126 242 lb 15.2 oz (110.2 kg)     Height 05/10/22 1126 5' 10.5" (1.791 m)     Head Circumference --      Peak Flow --      Pain Score 05/10/22 1125 0     Pain Loc --      Pain Edu? --      Excl. in [place]? --    No data found.  Updated Vital Signs BP (!) 146/78 (BP Location: Left Arm)   Pulse 98   Temp 98.2 F  (36.8 C) (Oral)   Resp 18   Ht 5' 10.5" (1.791 m)   Wt 242 lb 15.2 oz (110.2 kg)   SpO2 95%   BMI 34.37 kg/m       Physical Exam Vitals and nursing note reviewed.  Constitutional:      General: He is not in acute distress.    Appearance: Normal appearance. He is well-developed. He is not ill-appearing.  HENT:     Head: Normocephalic and atraumatic.  Eyes:     General: No scleral icterus.    Conjunctiva/sclera: Conjunctivae normal.  Cardiovascular:     Rate and Rhythm: Normal rate and regular rhythm.     Heart sounds: Normal heart sounds.  Pulmonary:     Effort: Pulmonary effort is normal. No respiratory distress.     Breath sounds: Normal breath sounds.  Abdominal:     Palpations: Abdomen is soft.     Tenderness: There is no abdominal tenderness. There is no right CVA tenderness or left CVA tenderness.  Musculoskeletal:     Cervical back: Neck supple.  Skin:    General: Skin is warm and dry.     Capillary Refill: Capillary refill takes less than 2 seconds.  Neurological:     General: No focal deficit present.     Mental Status: He is alert. Mental status is at baseline.     Motor: No weakness.     Gait: Gait normal.  Psychiatric:        Mood and Affect: Mood normal.        Behavior: Behavior normal.      UC Treatments / Results  Labs (all labs ordered are listed, but only abnormal results are displayed) Labs Reviewed  URINALYSIS, ROUTINE W REFLEX MICROSCOPIC - Abnormal; Notable for the following components:      Result Value   APPearance CLOUDY (*)    Glucose, UA >1,000 (*)    Hgb urine dipstick LARGE (*)    Bilirubin Urine SMALL (*)    Ketones, ur TRACE (*)    Protein, ur 100 (*)    Nitrite POSITIVE (*)    Leukocytes,Ua SMALL (*)    All other components within normal limits  URINALYSIS, MICROSCOPIC (REFLEX) - Abnormal; Notable for the following components:   Bacteria, UA MANY (*)    Non Squamous Epithelial PRESENT (*)    All other components within  normal limits  URINE CULTURE    EKG   Radiology No results found.  Procedures Procedures (including critical care time)  Medications Ordered in UC Medications - No data to display  Initial Impression / Assessment and Plan / UC Course  I have reviewed the triage vital signs and the nursing notes.  Pertinent labs & imaging results that were available during my care of the patient were reviewed by me and considered in my medical decision making (see chart for details).   67 year old male with prostate cancer presents for 3-day history of dysuria, frequency, urgency and hematuria x 1 episode.  Urinalysis shows cloudy urine with large hemoglobin, glucose, small bili, trace ketones, protein, positive nitrites and small leukocytes with many bacteria seen on microscopic analysis.  Consistent with urinary tract infection.  Treating with Bactrim DS and Pyridium.  Encourage increasing his fluid intake.  Advised going to emergency department if fever, abdominal pain, vomiting, flank pain or more episodes of hematuria.  Continue to follow with urology.   Final Clinical Impressions(s) / UC Diagnoses   Final diagnoses:  Acute cystitis with hematuria  Dysuria     Discharge Instructions      UTI: Based on either symptoms or urinalysis, you may have a urinary tract infection. We will send the urine for culture and call with results in a few days. Begin antibiotics at this time. Your symptoms should be much improved over the next 2-3 days. Increase rest and fluid intake. If for some reason symptoms are worsening or not improving after a couple of days or the urine culture  determines the antibiotics you are taking will not treat the infection, the antibiotics may be changed. Return or go to ER for fever, back pain, worsening urinary pain, discharge, increased blood in urine. May take Tylenol or Motrin OTC for pain relief or consider AZO if no contraindications      ED Prescriptions      Medication Sig Dispense Auth. Provider   phenazopyridine (PYRIDIUM) 200 MG tablet Take 1 tablet (200 mg total) by mouth 3 (three) times daily. 6 tablet Laurene Footman B, PA-C   sulfamethoxazole-trimethoprim (BACTRIM DS) 800-160 MG tablet Take 1 tablet by mouth 2 (two) times daily for 7 days. 14 tablet Gretta Cool      PDMP not reviewed this encounter.   Danton Clap, PA-C 05/10/22 1208

## 2022-05-10 NOTE — Discharge Instructions (Addendum)

## 2022-05-10 NOTE — ED Triage Notes (Signed)
Pt c/o dysuria, and hematuria. Started about 3 days ago. He states he had a prostates biopsy about 2 weeks ago. Pt states he was having lower back pain that resolved.

## 2022-05-13 LAB — URINE CULTURE: Culture: >=100000 — AB

## 2022-11-12 ENCOUNTER — Ambulatory Visit: Payer: Medicare Other | Admitting: Urology

## 2022-11-18 ENCOUNTER — Other Ambulatory Visit
Admission: RE | Admit: 2022-11-18 | Discharge: 2022-11-18 | Disposition: A | Discharge status: Home / Self Care | Payer: Medicare Other | Attending: Urology | Admitting: Urology

## 2022-11-18 DIAGNOSIS — N5203 Combined arterial insufficiency and corporo-venous occlusive erectile dysfunction: Secondary | ICD-10-CM | POA: Insufficient documentation

## 2022-11-18 DIAGNOSIS — C61 Malignant neoplasm of prostate: Secondary | ICD-10-CM | POA: Diagnosis present

## 2022-11-18 LAB — PSA: Prostatic Specific Antigen: 11.64 ng/mL — ABNORMAL HIGH (ref 0.00–4.00)

## 2022-11-26 ENCOUNTER — Ambulatory Visit (INDEPENDENT_AMBULATORY_CARE_PROVIDER_SITE_OTHER): Payer: Medicare Other | Admitting: Urology

## 2022-11-26 ENCOUNTER — Encounter: Payer: Self-pay | Admitting: Urology

## 2022-11-26 VITALS — BP 149/72 | HR 79 | Ht 70.5 in | Wt 232.0 lb

## 2022-11-26 DIAGNOSIS — N528 Other male erectile dysfunction: Secondary | ICD-10-CM | POA: Diagnosis not present

## 2022-11-26 DIAGNOSIS — C61 Malignant neoplasm of prostate: Secondary | ICD-10-CM

## 2022-11-26 NOTE — Progress Notes (Signed)
Reginald Phillips,acting as a scribe for Reginald Scotland, MD.,have documented all relevant documentation on the behalf of Reginald Scotland, MD,as directed by  Reginald Scotland, MD while in the presence of Reginald Scotland, MD.  11/26/2022 8:59 AM   Reginald Phillips 01-20-1956 [id number]  Referring provider: Marina Goodell, MD [address] [place],  [place] [address]  Chief Complaint  Patient presents with   Elevated PSA    HPI: 67 year-old male with a personal history of prostate cancer returns today for a 6 month follow up.   He underwent a prostate biopsy on 04/28/22 for further evaluation of elevated PSA. His TRUS volume was 61.3 grams. He had an abnormal rectal exam with induration on the left wall of the prostate as well as a family history of prostate cancer. He had two separate PI-RADS 4 lesions in the peripheral zone bilaterally.    On biopsy, he had one core of Gleason 3+3 prostate cancer, otherwise no pathology.  His most recently PSA on 11/18/2022 was 11.64.   Today, he also reports baseline erectile dysfunction. He use sildenafil as needed.  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 If you have any questions, ask your nurse or doctor.          acetaminophen 500 MG tablet Commonly known as: TYLENOL Take 1,000 mg by mouth daily as needed for moderate pain or headache.   calcium carbonate 500 MG chewable tablet Commonly known as: TUMS - dosed in mg elemental calcium Chew 2 tablets by mouth daily as needed for indigestion or heartburn.   co-enzyme Q-10 50 MG capsule Take 50 mg by mouth daily.   cyanocobalamin 1000 MCG tablet Commonly known as: VITAMIN B12 Take 1,000 mcg by mouth every evening.   fenofibrate 160 MG tablet Take 160 mg by mouth every evening.   glimepiride 2 MG tablet Commonly known as: AMARYL   glipiZIDE 5 MG tablet Commonly known as: GLUCOTROL Take 5 mg by mouth daily.   losartan 25 MG tablet Commonly known as: COZAAR Take 1 tablet by mouth daily. What changed: Another medication with the same name was removed. Continue taking this medication, and follow the directions you see here. Changed by: Morrie Sheldon     metFORMIN 1000 MG tablet Commonly known as: GLUCOPHAGE Take 1,000 mg by mouth 2 (two) times daily with a meal.   metoprolol succinate 50 MG 24 hr tablet Commonly known as: TOPROL-XL Take 1 tablet by mouth daily.   metoprolol tartrate 25 MG tablet Commonly known as: LOPRESSOR Take 25 mg by mouth 2 (two) times daily.   Mounjaro 7.5 MG/0.5ML Pen Generic drug: tirzepatide Inject 7.5 mg into the skin once a week.   Multi-Vitamins Tabs Take 1 tablet by mouth daily.   phenazopyridine 200 MG tablet Commonly known as: PYRIDIUM Take 1 tablet (200 mg total) by mouth 3  (three) times daily.   rivaroxaban 20 MG Tabs tablet Commonly known as: XARELTO Take 20 mg by mouth daily with breakfast.   rosuvastatin 5 MG tablet Commonly known as: CRESTOR Take 5 mg by mouth daily at 6 PM. Takes on Mon, Wed and Fri   sildenafil 20 MG tablet Commonly known as: Revatio Take 1 tablet (20 mg total) by mouth as needed. Take 1-5 tabs as needed prior to intercourse   Tresiba FlexTouch 100 UNIT/ML FlexTouch Pen Generic drug: insulin degludec        Family History: Family History  Problem Relation Age of Onset   Heart disease Mother    Heart disease Father    Heart attack Father     Social History:  reports that he has never smoked. He has never used smokeless tobacco. He reports that he does not drink alcohol and does not use drugs.   Physical Exam: BP (!) 149/72 (BP Location: Left Arm, Patient Position: Sitting, Cuff Size: Large)   Pulse 79   Ht 5' 10.5" (1.791 m)   Wt 232 lb (105.2 kg)   BMI 32.82 kg/m   Constitutional:  Alert and oriented, No acute distress. HEENT: [place] AT, moist mucus membranes.  Trachea midline, no masses. Neurologic: Grossly intact, no focal deficits, moving all 4 extremities. Psychiatric: Normal mood and affect.   Assessment & Plan:    1. Prostate cancer - Low risk - He does have a significant history of PSA fluctuation as seen in trend outlined as above - It is reasonable to continue to trend his PSA. We could check it again in 3 months and if it is not improving, consider going ahead with fusion biopsy to specialize target those PI-RADS 4 lesions as seen on MR. 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   2. Erectile dysfunction - Continue sildenafil as needed  Return in about 3 months (around 02/26/2023) for PSA lab visit only . Return in 6 months for PSA and DRE.   I have reviewed the above documentation for accuracy and completeness, and I agree with the above.   Reginald Scotland, MD  [facility] [address], [address] [place], [place] [address] [phone]

## 2023-02-28 ENCOUNTER — Other Ambulatory Visit
Admission: RE | Admit: 2023-02-28 | Discharge: 2023-02-28 | Disposition: A | Discharge status: Home / Self Care | Payer: Medicare Other | Attending: Urology | Admitting: Urology

## 2023-02-28 DIAGNOSIS — C61 Malignant neoplasm of prostate: Secondary | ICD-10-CM | POA: Insufficient documentation

## 2023-02-28 LAB — PSA: Prostatic Specific Antigen: 16.48 ng/mL — ABNORMAL HIGH (ref 0.00–4.00)

## 2023-03-22 ENCOUNTER — Other Ambulatory Visit: Payer: Self-pay

## 2023-03-22 DIAGNOSIS — R972 Elevated prostate specific antigen [PSA]: Secondary | ICD-10-CM

## 2023-05-25 ENCOUNTER — Other Ambulatory Visit: Payer: Medicare Other

## 2023-05-25 ENCOUNTER — Other Ambulatory Visit
Admission: RE | Admit: 2023-05-25 | Discharge: 2023-05-25 | Disposition: A | Discharge status: Home / Self Care | Payer: Medicare Other | Attending: Urology | Admitting: Urology

## 2023-05-25 DIAGNOSIS — C61 Malignant neoplasm of prostate: Secondary | ICD-10-CM | POA: Diagnosis present

## 2023-05-25 LAB — PSA: Prostatic Specific Antigen: 12.08 ng/mL — ABNORMAL HIGH (ref 0.00–4.00)

## 2023-06-03 ENCOUNTER — Ambulatory Visit: Payer: Medicare Other | Admitting: Urology

## 2023-06-10 ENCOUNTER — Ambulatory Visit (INDEPENDENT_AMBULATORY_CARE_PROVIDER_SITE_OTHER): Payer: Medicare Other | Admitting: Urology

## 2023-06-10 VITALS — BP 147/82 | HR 60 | Ht 68.5 in | Wt 239.1 lb

## 2023-06-10 DIAGNOSIS — C61 Malignant neoplasm of prostate: Secondary | ICD-10-CM

## 2023-06-10 DIAGNOSIS — N528 Other male erectile dysfunction: Secondary | ICD-10-CM

## 2023-06-10 MED ORDER — SILDENAFIL CITRATE 100 MG PO TABS: 100.0000 mg | ORAL_TABLET | Freq: Every day | ORAL | 11 refills | Status: AC | PRN

## 2023-06-10 NOTE — Progress Notes (Signed)
 I,Amy L Pierron,acting as a scribe for [name], MD.,have documented all relevant documentation on the behalf of [name], MD,as directed by  [name], MD while in the presence of [name], MD.  06/10/2023 5:58 PM   Reginald Phillips 07-29-1955 [id number]  Referring provider: Marina Goodell, MD [address] [place],  [place] [address]  Chief Complaint  Patient presents with   Prostate Cancer    HPI: 68 year-old male with a personal history of prostate cancer presents today for six month follow-up.  He underwent a cognitive fusion biopsy on 04/28/22 for further evaluation of elevated PSA. His TRUS volume was 61.3 grams. He had an abnormal rectal exam with induration on the left wall of the prostate as well as a family history of prostate cancer. He had two separate PI-RADS 4 lesions in the peripheral zone bilaterally.    On biopsy, he had one core of Gleason 3+3 prostate cancer, otherwise no pathology.   His most recently PSA on 05/25/2023 was 12.08.   He is doing well overall with no new issues or complaints.  PSA trend: 05/25/2023    12.08 02/28/2023    16.48 11/18/2022    11.64 02/23/2022   8.5 08/13/2021    13.3 02/06/2021  7.93 07/29/2020      7.44 06/28/2019      6.17 12/01/2018      5.53 06/05/2018    6.05  PMH: Past Medical History:  Diagnosis Date   B12 deficiency 11/12/2013   Chronic kidney disease    50 % funtion from too much metformin   Diabetes mellitus without complication (HCC)    Dysrhythmia    Hernia cerebri (HCC)    Hyperlipemia, mixed 10/24/2013   Hypertension    Hypertension, essential, benign 10/24/2013   Neuropathy    Neuropathy    Paroxysmal A-fib (HCC) 11/06/2014   Periumbilical hernia    Small bowel obstruction (HCC)    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 If you have any questions, ask your nurse or doctor.          STOP taking these medications    sildenafil 20 MG tablet Commonly known as: Revatio       TAKE these medications    acetaminophen 500 MG tablet Commonly known as: TYLENOL Take 1,000 mg by mouth daily as needed for moderate pain or headache.   calcium carbonate 500 MG chewable tablet Commonly known as: TUMS - dosed in mg elemental calcium Chew 2 tablets by mouth daily as needed for indigestion or heartburn.   co-enzyme Q-10 50 MG capsule Take 50 mg by mouth daily.   cyanocobalamin 1000 MCG tablet Commonly known as: VITAMIN B12 Take 1,000 mcg by mouth every evening.   fenofibrate 160 MG tablet Take 160 mg by mouth every evening.   glimepiride 2 MG tablet  Commonly known as: AMARYL   glipiZIDE 5 MG tablet Commonly known as: GLUCOTROL Take 5 mg by mouth daily.   losartan 25 MG tablet Commonly known as: COZAAR Take 1 tablet by mouth daily.   metFORMIN 1000 MG tablet Commonly known as: GLUCOPHAGE Take 1,000 mg by mouth 2 (two) times daily with a meal.   metoprolol succinate 50 MG 24 hr tablet Commonly known as: TOPROL-XL Take 1 tablet by mouth daily.   metoprolol tartrate 25 MG tablet Commonly known as: LOPRESSOR Take 25 mg by mouth 2 (two) times daily.   Mounjaro 7.5 MG/0.5ML Pen Generic drug: tirzepatide Inject 7.5 mg into the skin once a week.   Multi-Vitamins Tabs Take 1 tablet by mouth daily.   phenazopyridine 200 MG tablet Commonly known as: PYRIDIUM Take 1 tablet (200 mg total) by  mouth 3 (three) times daily.   rivaroxaban 20 MG Tabs tablet Commonly known as: XARELTO Take 20 mg by mouth daily with breakfast.   rosuvastatin 5 MG tablet Commonly known as: CRESTOR Take 5 mg by mouth daily at 6 PM. Takes on Mon, Wed and Fri   sildenafil 100 MG tablet Commonly known as: VIAGRA Take 1 tablet (100 mg total) by mouth daily as needed for erectile dysfunction.   Evaristo Bury FlexTouch 100 UNIT/ML FlexTouch Pen Generic drug: insulin degludec        Family History: Family History  Problem Relation Age of Onset   Heart disease Mother    Heart disease Father    Heart attack Father     Social History:  reports that he has never smoked. He has never used smokeless tobacco. He reports that he does not drink alcohol and does not use drugs.   Physical Exam: BP (!) 147/82   Pulse 60   Ht 5' 8.5" (1.74 m)   Wt 239 lb 2 oz (108.5 kg)   BMI 35.83 kg/m   Constitutional:  Alert and oriented, No acute distress. HEENT:  AT, moist mucus membranes.  Trachea midline, no masses. GU: Prostate firmness on left base.  Neurologic: Grossly intact, no focal deficits, moving all 4 extremities. Psychiatric: Normal mood and affect.   Assessment & Plan:    1. Prostate cancer  - Low risk; on active surveillance. He does has a history of PSA fluctuation. Current PSA within previous range.  2. Erectile Dysfunction  - Sildenafil 100 mg refilled.  Return in about 1 year (around 06/09/2024).  I have reviewed the above documentation for accuracy and completeness, and I agree with the above.   [name], MD   [facility] [address], [address] [place], [place] [address] [phone]

## 2023-12-08 ENCOUNTER — Encounter: Payer: Self-pay | Admitting: Urology

## 2023-12-12 ENCOUNTER — Other Ambulatory Visit
Admission: RE | Admit: 2023-12-12 | Discharge: 2023-12-12 | Disposition: A | Discharge status: Home / Self Care | Attending: Urology | Admitting: Urology

## 2023-12-12 DIAGNOSIS — C61 Malignant neoplasm of prostate: Secondary | ICD-10-CM | POA: Diagnosis present

## 2023-12-12 LAB — PSA: Prostatic Specific Antigen: 16.39 ng/mL — ABNORMAL HIGH (ref 0.00–4.00)

## 2024-04-12 ENCOUNTER — Encounter: Payer: Self-pay | Admitting: Urology

## 2024-05-30 DIAGNOSIS — C61 Malignant neoplasm of prostate: Secondary | ICD-10-CM | POA: Insufficient documentation

## 2024-05-30 NOTE — Assessment & Plan Note (Addendum)
 Hx of low-risk prostate Ca (dx Jan 2024)   - MRI (2023) w/ two PIRADS 4 lesions  - cognitive fusion biopsy-- GG1 in 1/12 cores, PSA ~11-16  - ~75g gland  Reviewed his clinical history and diagnosis.  He has been on active surveillance for very low risk prostate cancer.   - Due for interval PSA -Due for interval MRI prostate versus 12 core systematic biopsy

## 2024-05-30 NOTE — Progress Notes (Unsigned)
" ° °  06/06/2024 9:23 AM   Alm JULIANNA Ada 11-13-1955 [id number]  Reason for visit: Follow up prostate Ca   HPI: 69 y.o. male, initial follow up with me today, previously seen by Dr. Penne  Prior HPI: Hx of prostate Ca  - Hx of low-risk prostate Ca (dx Jan 2024)  - MRI w/ two PIRADS 4 lesions  - cognitive fusion biopsy-- GG1 in 1/12 cores  - ~75g gland  PSA trend: 05/25/2023    12.08 02/28/2023    16.48 11/18/2022    11.64 02/23/2022   8.5 08/13/2021    13.3 02/06/2021  7.93 07/29/2020      7.44 06/28/2019      6.17 12/01/2018      5.53 06/05/2018    6.05    Physical Exam: There were no vitals taken for this visit.   Constitutional:  Alert and oriented, No acute distress. GU: ***  Laboratory Data: Component Ref Range & Units (hover) 5 mo ago (12/12/23) 1 yr ago (05/25/23) 1 yr ago (02/28/23) 1 yr ago (11/18/22) 5 yr ago (12/01/18)  Prostatic Specific Antigen 16.39 High  12.08 High  CM 16.48 High  CM 11.64 High  CM 5.53 High  CM    Pertinent Imaging: N/A    Assessment & Plan:    Prostate cancer [facility]) Assessment & Plan: Hx of low-risk prostate Ca (dx Jan 2024)   - MRI (2023) w/ two PIRADS 4 lesions  - cognitive fusion biopsy-- GG1 in 1/12 cores, PSA ~11-16  - ~75g gland  Reviewed his clinical history and diagnosis.  He has been on active surveillance for very low risk prostate cancer.   - Due for interval PSA -Due for interval MRI prostate versus 12 core systematic biopsy        Penne JONELLE Skye, MD  [facility] [address] [place], [place] [address] [phone] "

## 2024-06-01 ENCOUNTER — Ambulatory Visit: Payer: Medicare Other | Admitting: Urology

## 2024-06-06 ENCOUNTER — Ambulatory Visit: Admitting: Urology

## 2024-06-06 DIAGNOSIS — C61 Malignant neoplasm of prostate: Secondary | ICD-10-CM
